# Patient Record
Sex: Male | Born: 1970 | Race: Black or African American | Hispanic: No | Marital: Single | Smoking: Current every day smoker
Health system: Southern US, Community
[De-identification: ages and names within clinical notes are randomized; demographics above are authoritative.]

---

## 2000-01-23 ENCOUNTER — Emergency Department (HOSPITAL_COMMUNITY): Admission: EM | Admit: 2000-01-23 | Discharge: 2000-01-23 | Payer: Self-pay | Admitting: Emergency Medicine

## 2004-10-20 ENCOUNTER — Emergency Department (HOSPITAL_COMMUNITY): Admission: EM | Admit: 2004-10-20 | Discharge: 2004-10-20 | Payer: Self-pay | Admitting: Emergency Medicine

## 2008-10-14 ENCOUNTER — Emergency Department (HOSPITAL_COMMUNITY): Admission: EM | Admit: 2008-10-14 | Discharge: 2008-10-14 | Payer: Self-pay | Admitting: Emergency Medicine

## 2009-02-20 ENCOUNTER — Emergency Department (HOSPITAL_COMMUNITY): Admission: EM | Admit: 2009-02-20 | Discharge: 2009-02-20 | Payer: Self-pay | Admitting: Emergency Medicine

## 2009-02-21 ENCOUNTER — Ambulatory Visit (HOSPITAL_COMMUNITY): Admission: RE | Admit: 2009-02-21 | Discharge: 2009-02-21 | Payer: Self-pay | Admitting: Physician Assistant

## 2009-10-19 ENCOUNTER — Emergency Department (HOSPITAL_COMMUNITY): Admission: EM | Admit: 2009-10-19 | Discharge: 2009-10-19 | Payer: Self-pay | Admitting: Emergency Medicine

## 2009-11-25 ENCOUNTER — Emergency Department (HOSPITAL_COMMUNITY): Admission: EM | Admit: 2009-11-25 | Discharge: 2009-11-25 | Payer: Self-pay | Admitting: Emergency Medicine

## 2010-06-30 ENCOUNTER — Emergency Department (HOSPITAL_COMMUNITY): Admission: EM | Admit: 2010-06-30 | Discharge: 2010-06-30 | Payer: Self-pay | Admitting: Emergency Medicine

## 2010-12-27 ENCOUNTER — Emergency Department (HOSPITAL_COMMUNITY)
Admission: EM | Admit: 2010-12-27 | Discharge: 2010-12-27 | Payer: Self-pay | Source: Home / Self Care | Admitting: Emergency Medicine

## 2011-06-16 ENCOUNTER — Emergency Department (HOSPITAL_COMMUNITY)
Admission: EM | Admit: 2011-06-16 | Discharge: 2011-06-16 | Disposition: A | Discharge status: Home / Self Care | Payer: Commercial Managed Care - PPO | Attending: Emergency Medicine | Admitting: Emergency Medicine

## 2011-06-16 DIAGNOSIS — L03211 Cellulitis of face: Secondary | ICD-10-CM | POA: Insufficient documentation

## 2011-06-16 DIAGNOSIS — L0201 Cutaneous abscess of face: Secondary | ICD-10-CM | POA: Insufficient documentation

## 2011-08-05 ENCOUNTER — Emergency Department (HOSPITAL_COMMUNITY)
Admission: EM | Admit: 2011-08-05 | Discharge: 2011-08-05 | Disposition: A | Discharge status: Home / Self Care | Payer: Commercial Managed Care - PPO | Attending: Emergency Medicine | Admitting: Emergency Medicine

## 2011-08-05 DIAGNOSIS — L0201 Cutaneous abscess of face: Secondary | ICD-10-CM | POA: Insufficient documentation

## 2011-08-05 DIAGNOSIS — L03211 Cellulitis of face: Secondary | ICD-10-CM | POA: Insufficient documentation

## 2013-11-06 ENCOUNTER — Emergency Department (HOSPITAL_COMMUNITY)
Admission: EM | Admit: 2013-11-06 | Discharge: 2013-11-06 | Disposition: A | Discharge status: Home / Self Care | Payer: Managed Care, Other (non HMO) | Attending: Emergency Medicine | Admitting: Emergency Medicine

## 2013-11-06 ENCOUNTER — Emergency Department (HOSPITAL_COMMUNITY): Payer: Managed Care, Other (non HMO)

## 2013-11-06 ENCOUNTER — Encounter (HOSPITAL_COMMUNITY): Payer: Self-pay | Admitting: Emergency Medicine

## 2013-11-06 DIAGNOSIS — F17200 Nicotine dependence, unspecified, uncomplicated: Secondary | ICD-10-CM | POA: Insufficient documentation

## 2013-11-06 DIAGNOSIS — M5416 Radiculopathy, lumbar region: Secondary | ICD-10-CM

## 2013-11-06 DIAGNOSIS — IMO0001 Reserved for inherently not codable concepts without codable children: Secondary | ICD-10-CM | POA: Insufficient documentation

## 2013-11-06 DIAGNOSIS — IMO0002 Reserved for concepts with insufficient information to code with codable children: Secondary | ICD-10-CM | POA: Insufficient documentation

## 2013-11-06 DIAGNOSIS — R209 Unspecified disturbances of skin sensation: Secondary | ICD-10-CM | POA: Insufficient documentation

## 2013-11-06 MED ORDER — DIAZEPAM 5 MG PO TABS: 5.0000 mg | ORAL_TABLET | Freq: Four times a day (QID) | ORAL | Status: DC | PRN

## 2013-11-06 MED ORDER — PREDNISONE 20 MG PO TABS
60.0000 mg | ORAL_TABLET | Freq: Once | ORAL | Status: AC
  Administered 2013-11-06: 60 mg via ORAL
  Filled 2013-11-06: qty 3

## 2013-11-06 MED ORDER — HYDROMORPHONE HCL PF 1 MG/ML IJ SOLN
1.0000 mg | Freq: Once | INTRAMUSCULAR | Status: AC
  Administered 2013-11-06: 1 mg via INTRAMUSCULAR
  Filled 2013-11-06: qty 1

## 2013-11-06 MED ORDER — OXYCODONE-ACETAMINOPHEN 5-325 MG PO TABS: 1.0000 | ORAL_TABLET | ORAL | Status: DC | PRN

## 2013-11-06 MED ORDER — PREDNISONE 10 MG PO TABS: ORAL_TABLET | ORAL | Status: DC

## 2013-11-06 MED ORDER — DIAZEPAM 5 MG PO TABS
5.0000 mg | ORAL_TABLET | Freq: Once | ORAL | Status: AC
  Administered 2013-11-06: 5 mg via ORAL
  Filled 2013-11-06: qty 1

## 2013-11-06 NOTE — ED Provider Notes (Signed)
CSN: [id number]     Arrival date & time 11/06/13  1237 History  This chart was scribed for non-physician practitioner, Jaynie Crumble, PA-C working with Gilda Crease, MD by Greggory Stallion, ED scribe. This patient was seen in room TR07C/TR07C and the patient's care was started at 2:23 PM.   Chief Complaint  Patient presents with  . Back Pain   The history is provided by the patient. No language interpreter was used.   HPI Comments: Stephen Nielsen is a 42 y.o. male who presents to the Emergency Department complaining of gradual onset, constant lower back pain that radiates into his right leg that stared this morning. Denies injury. Pt states he was awake for a few hours before the pain started. Movement worsens the pain. He states he has some numbness down his bilateral thighs. Pt has used icy hot and taken hydrocodone around 11 AM with no relief. Denies fever, neck pain, abdominal pain, bowel or bladder incontinence. He has history of back pain but states this is different than his normal pain.   History reviewed. No pertinent past medical history. History reviewed. No pertinent past surgical history. No family history on file. History  Substance Use Topics  . Smoking status: Current Every Day Smoker  . Smokeless tobacco: Not on file  . Alcohol Use: Yes    Review of Systems  Constitutional: Negative for fever.  Gastrointestinal: Negative for abdominal pain.  Genitourinary:       Negative for bowel or bladder incontinence.   Musculoskeletal: Positive for back pain and myalgias. Negative for neck pain.  Neurological: Positive for numbness.  All other systems reviewed and are negative.    Allergies  Review of patient's allergies indicates no known allergies.  Home Medications  No current outpatient prescriptions on file.  BP 140/85  Pulse 97  Temp(Src) 98.8 F (37.1 C) (Oral)  Resp 16  Ht 5\' 9"  (1.753 m)  Wt 170 lb (77.111 kg)  BMI 25.09 kg/m2  SpO2  96%  Physical Exam  Nursing note and vitals reviewed. Constitutional: He is oriented to person, place, and time. He appears well-developed and well-nourished. No distress.  HENT:  Head: Normocephalic and atraumatic.  Eyes: EOM are normal.  Neck: Neck supple. No tracheal deviation present.  Cardiovascular: Normal rate.   Pulmonary/Chest: Effort normal. No respiratory distress.  Musculoskeletal: Normal range of motion.  Midline lumbar spine tenderness. No paravertebral tenderness. Tenderness in right buttock and right SI joint. Pain with bilateral straight leg rainse  Neurological: He is alert and oriented to person, place, and time.  Reflex Scores:      Patellar reflexes are 3+ on the right side and 3+ on the left side. 5/5 lower extremity strength. Normal sensation in bilateral thighs, lower legs and feet. Pt able to dorsiflex bilateral feet and great toes.  Skin: Skin is warm and dry.  Psychiatric: He has a normal mood and affect. His behavior is normal.    ED Course  Procedures (including critical care time)  DIAGNOSTIC STUDIES: Oxygen Saturation is 96% on RA, normal by my interpretation.    COORDINATION OF CARE: 2:27 PM-Discussed treatment plan which includes xray with pt at bedside and pt agreed to plan.   Labs Review Labs Reviewed - No data to display Imaging Review Dg Lumbar Spine Complete  11/06/2013   CLINICAL DATA:  Low back pain radiating into the right leg. No known injury.  EXAM: LUMBAR SPINE - COMPLETE 4+ VIEW  COMPARISON:  Lumbar spine radiographs  10/19/2009.  FINDINGS: There are 5 lumbar type vertebral bodies. The alignment is normal. There is stable mild narrowing of the L4-5 disc space. The additional disc spaces are preserved. There is mild intervertebral spurring from L2-3 through L4-5. Oblique views demonstrate no pars defect or significant facet arthropathy. There is no evidence of acute fracture.  IMPRESSION: Stable mild disc space narrowing at L4-5. No acute  osseous findings.   Electronically Signed   By: Roxy Horseman M.D.   On: 11/06/2013 15:09    EKG Interpretation   None       MDM   1. Lumbar radiculopathy     PT with lower back pain. Hx of the same. States was doing well up until this morning when was just walking in his house and felt sharp pain going down both legs. States: Down right leg with a left. Currently denies any numbness or weakness in his legs. No loss of bowels or urine. He has no signs of cauda equina. He is afebrile. He denies any abdominal pain. He has no urinary symptoms. He does have history of degenerative disc disease. X-ray obtained due to midline tenderness and showed stable mild disc space narrowing at L4-L5. I suspect his symptoms could be do to a herniated disc. Will treat with steroids, pain medications, muscle relaxant. Patient is to followup with his doctor if pain is not improving for further imaging and treatment.  Filed Vitals:   11/06/13 1240 11/06/13 1245  BP: 140/85   Pulse: 97   Temp: 98.8 F (37.1 C)   TempSrc: Oral   Resp: 16   Height:  5\' 9"  (1.753 m)  Weight:  170 lb (77.111 kg)  SpO2: 96%    I personally performed the services described in this documentation, which was scribed in my presence. The recorded information has been reviewed and is accurate.    Lottie Mussel, PA-C 11/06/13 1535

## 2013-11-06 NOTE — ED Notes (Signed)
Pt c/o low back pain onset today. Pt tried icy hot and hydrocodone without relief. Pt denies recent injury. Pt reports numbness at finger tips and down B/L legs. Pt denies incontinence of bowel or urine.

## 2013-11-08 NOTE — ED Provider Notes (Signed)
Medical screening examination/treatment/procedure(s) were performed by non-physician practitioner and as supervising physician I was immediately available for consultation/collaboration.  Gilda Crease, MD 11/08/13 [phone]

## 2014-07-15 ENCOUNTER — Emergency Department (HOSPITAL_COMMUNITY)
Admission: EM | Admit: 2014-07-15 | Discharge: 2014-07-15 | Disposition: A | Discharge status: Home / Self Care | Payer: 59 | Attending: Emergency Medicine | Admitting: Emergency Medicine

## 2014-07-15 ENCOUNTER — Encounter (HOSPITAL_COMMUNITY): Payer: Self-pay | Admitting: Emergency Medicine

## 2014-07-15 DIAGNOSIS — Z79899 Other long term (current) drug therapy: Secondary | ICD-10-CM | POA: Diagnosis not present

## 2014-07-15 DIAGNOSIS — L089 Local infection of the skin and subcutaneous tissue, unspecified: Secondary | ICD-10-CM | POA: Diagnosis not present

## 2014-07-15 DIAGNOSIS — W57XXXA Bitten or stung by nonvenomous insect and other nonvenomous arthropods, initial encounter: Principal | ICD-10-CM

## 2014-07-15 DIAGNOSIS — IMO0001 Reserved for inherently not codable concepts without codable children: Secondary | ICD-10-CM | POA: Insufficient documentation

## 2014-07-15 DIAGNOSIS — IMO0002 Reserved for concepts with insufficient information to code with codable children: Secondary | ICD-10-CM | POA: Insufficient documentation

## 2014-07-15 DIAGNOSIS — T148 Other injury of unspecified body region: Secondary | ICD-10-CM | POA: Diagnosis not present

## 2014-07-15 DIAGNOSIS — Y9289 Other specified places as the place of occurrence of the external cause: Secondary | ICD-10-CM | POA: Diagnosis not present

## 2014-07-15 DIAGNOSIS — F17200 Nicotine dependence, unspecified, uncomplicated: Secondary | ICD-10-CM | POA: Insufficient documentation

## 2014-07-15 DIAGNOSIS — Y9389 Activity, other specified: Secondary | ICD-10-CM | POA: Insufficient documentation

## 2014-07-15 DIAGNOSIS — L03113 Cellulitis of right upper limb: Secondary | ICD-10-CM

## 2014-07-15 MED ORDER — CEPHALEXIN 250 MG PO CAPS
500.0000 mg | ORAL_CAPSULE | Freq: Once | ORAL | Status: AC
  Administered 2014-07-15: 500 mg via ORAL
  Filled 2014-07-15: qty 2

## 2014-07-15 MED ORDER — SULFAMETHOXAZOLE-TMP DS 800-160 MG PO TABS
1.0000 | ORAL_TABLET | Freq: Once | ORAL | Status: AC
  Administered 2014-07-15: 1 via ORAL
  Filled 2014-07-15: qty 1

## 2014-07-15 MED ORDER — CEPHALEXIN 500 MG PO CAPS: 500.0000 mg | ORAL_CAPSULE | Freq: Four times a day (QID) | ORAL | Status: DC

## 2014-07-15 MED ORDER — SULFAMETHOXAZOLE-TRIMETHOPRIM 800-160 MG PO TABS: 1.0000 | ORAL_TABLET | Freq: Two times a day (BID) | ORAL | Status: DC

## 2014-07-15 NOTE — ED Notes (Signed)
On arrival to room TR 4 pt stated "Why am here? I think i have a bug bite. Where are my children ? How did I get here?EDP Wentz in pt room with PA to assess PT. Pt answered all questions A/O x4 in the room with EDP.

## 2014-07-15 NOTE — Discharge Instructions (Signed)
Warm compresses. Keep it clean. Wash with soap and water. Take keflex and bactrim both as prescribed until all gone. Follow up in 2 days for recheck.    Abscess Care After An abscess (also called a boil or furuncle) is an infected area that contains a collection of pus. Signs and symptoms of an abscess include pain, tenderness, redness, or hardness, or you may feel a moveable soft area under your skin. An abscess can occur anywhere in the body. The infection may spread to surrounding tissues causing cellulitis. A cut (incision) by the surgeon was made over your abscess and the pus was drained out. Gauze may have been packed into the space to provide a drain that will allow the cavity to heal from the inside outwards. The boil may be painful for 5 to 7 days. Most people with a boil do not have high fevers. Your abscess, if seen early, may not have localized, and may not have been lanced. If not, another appointment may be required for this if it does not get better on its own or with medications. HOME CARE INSTRUCTIONS   Only take over-the-counter or prescription medicines for pain, discomfort, or fever as directed by your caregiver.  When you bathe, soak and then remove gauze or iodoform packs at least daily or as directed by your caregiver. You may then wash the wound gently with mild soapy water. Repack with gauze or do as your caregiver directs. SEEK IMMEDIATE MEDICAL CARE IF:   You develop increased pain, swelling, redness, drainage, or bleeding in the wound site.  You develop signs of generalized infection including muscle aches, chills, fever, or a general ill feeling.  An oral temperature above 102 F (38.9 C) develops, not controlled by medication. See your caregiver for a recheck if you develop any of the symptoms described above. If medications (antibiotics) were prescribed, take them as directed. Document Released: 06/19/2005 Document Revised: 02/23/2012 Document Reviewed:  02/14/2008 [facility] Patient Information 2015 [place], [place]. This information is not intended to replace advice given to you by your health care provider. Make sure you discuss any questions you have with your health care provider.

## 2014-07-15 NOTE — ED Provider Notes (Signed)
CSN: [id number]     Arrival date & time 07/15/14  1612 History  This chart was scribed for Jaynie Crumble, PA, working with Flint Melter, MD by Chestine Spore, ED Scribe. The patient was seen in room TR04C/TR04C at 4:42 PM.   Chief Complaint  Patient presents with  . Insect Bite     The history is provided by the patient. No language interpreter was used.   HPI Comments: Stephen Nielsen is a 43 y.o. male who presents to the Emergency Department complaining of an insect bite onset yesterday. He states that he does not know why he is here. He states that he drove himself here because of his arm. He states that he knows where he is. He states that got bit by something at the pool yesterday. He denies any pain to the area.  He denies fevers, chills, and any other associated symptoms.  He denies any alcohol use today. No treatment tried prior to their arrival. No history of the same.   History reviewed. No pertinent past medical history. History reviewed. No pertinent past surgical history. History reviewed. No pertinent family history. History  Substance Use Topics  . Smoking status: Current Every Day Smoker    Types: Cigarettes  . Smokeless tobacco: Not on file  . Alcohol Use: Yes    Review of Systems  Constitutional: Negative for fever and chills.  Skin: Positive for color change (redness to the area) and wound (insect bite to the right forearm).       Swelling around the insect bite      Allergies  Review of patient's allergies indicates no known allergies.  Home Medications   Prior to Admission medications   Medication Sig Start Date End Date Taking? Authorizing Provider  diazepam (VALIUM) 5 MG tablet Take 1 tablet (5 mg total) by mouth every 6 (six) hours as needed for muscle spasms. 11/06/13   Brayla Pat A Dempsey Ahonen, PA-C  Hydrocodone-Acetaminophen (VICODIN PO) Take 1 tablet by mouth once.    Historical Provider, MD  oxyCODONE-acetaminophen (PERCOCET) 5-325 MG per  tablet Take 1 tablet by mouth every 4 (four) hours as needed for severe pain. 11/06/13   Loralee Weitzman A Geofrey Silliman, PA-C  predniSONE (DELTASONE) 10 MG tablet Take 5 tab day 1, take 4 tab day 2, take 3 tab day 3, take 2 tab day 4, and take 1 tab day 5 11/06/13   Evah Rashid A Zaid Tomes, PA-C   BP 149/80  Pulse 94  Temp(Src) 98.6 F (37 C) (Oral)  Resp 18  SpO2 97%  Physical Exam  Nursing note and vitals reviewed. Constitutional: He is oriented to person, place, and time. He appears well-developed and well-nourished. No distress.  HENT:  Head: Normocephalic and atraumatic.  Eyes: EOM are normal.  Neck: Neck supple. No tracheal deviation present.  Cardiovascular: Normal rate.   Pulmonary/Chest: Effort normal. No respiratory distress.  Musculoskeletal: Normal range of motion.  Neurological: He is alert and oriented to person, place, and time.  Skin: Skin is warm and dry.  Less than 1 cm pustule to the right forearm, small purulent drainage, no surrounding fluctuance. Mild induration erythema extending up her right arm with a streak going approximately halfway up the right upper arm.  erythema extends to the mid forearm. Tender to palpation. Full range of motion of the wrist and elbow joints. Radial pulses intact. compartments are soft.   Psychiatric: He has a normal mood and affect. His behavior is normal.    ED Course  Procedures (including critical care time) DIAGNOSTIC STUDIES: Oxygen Saturation is 97% on room air, normal by my interpretation.    COORDINATION OF CARE: 4:55 PM-Discussed treatment plan which includes Keflex and Bactrim with pt at bedside and pt agreed to plan.   Labs Review Labs Reviewed - No data to display  Imaging Review No results found.   EKG Interpretation None      MDM   Final diagnoses:  Pustule  Cellulitis of right forearm   Patient is here with a small pustule to the right forearm with surrounding cellulitis. Suspect possible infection. Will treat  with Keflex and Bactrim. Rales in exam room, patient had 2 episodes of bizarre behavior where he all of a sudden stated that he did not know where he was and did not know where his wife and kids were. When I assessed him further, he was completely oriented to self, place, time. He then quickly said "of course I know where my wife and kids are, their home." Patient did not have any neuro deficits on exam. I discussed this with Dr. Effie ShyWentz who came by and saw patient, and agreed that patient is okay to be discharged home with treatment for his cellulitis.  Filed Vitals:   07/15/14 1616  BP: 149/80  Pulse: 94  Temp: 98.6 F (37 C)  Resp: 18      I personally performed the services described in this documentation, which was scribed in my presence. The recorded information has been reviewed and is accurate.     Lottie Musselatyana A Andras Grunewald, PA-C 07/15/14 1725

## 2014-07-15 NOTE — ED Notes (Signed)
Declined W/C at D/C and was escorted to lobby by RN. 

## 2014-07-15 NOTE — ED Provider Notes (Signed)
  Face-to-face evaluation   History: He was bitten or stung by an insect yesterday, at a water park. Today, the pain is worse, and he has redness, associated with the area, as well as on his right upper arm Physical exam: Alert, somewhat anxious, but cooperative and calm. He is well oriented. He is lucid. Right forearm has an open area about 1 cm, with small amount of drainage and associated induration. There is mild erythema spreading proximally to the mid upper arm.  Medical screening examination/treatment/procedure(s) were conducted as a shared visit with non-physician practitioner(s) and myself.  I personally evaluated the patient during the encounter  Flint MelterElliott L Thurley Francesconi, MD 07/16/14 [phone]

## 2014-07-15 NOTE — ED Notes (Signed)
Pt presents to department for evaluation of insect bite to R arm. Noticed today after swimming. Redness and swelling noted. Pt is alert and oriented x4.

## 2015-03-18 ENCOUNTER — Encounter (HOSPITAL_COMMUNITY): Payer: Self-pay

## 2015-03-18 ENCOUNTER — Emergency Department (HOSPITAL_COMMUNITY)
Admission: EM | Admit: 2015-03-18 | Discharge: 2015-03-18 | Disposition: A | Discharge status: Home / Self Care | Payer: Commercial Managed Care - PPO | Attending: Emergency Medicine | Admitting: Emergency Medicine

## 2015-03-18 DIAGNOSIS — R21 Rash and other nonspecific skin eruption: Secondary | ICD-10-CM

## 2015-03-18 DIAGNOSIS — Z720 Tobacco use: Secondary | ICD-10-CM | POA: Insufficient documentation

## 2015-03-18 DIAGNOSIS — Z792 Long term (current) use of antibiotics: Secondary | ICD-10-CM | POA: Insufficient documentation

## 2015-03-18 MED ORDER — DIPHENHYDRAMINE HCL 25 MG PO TABS: 25.0000 mg | ORAL_TABLET | Freq: Four times a day (QID) | ORAL | Status: DC

## 2015-03-18 MED ORDER — HYDROCORTISONE 1 % EX CREA: TOPICAL_CREAM | CUTANEOUS | Status: DC

## 2015-03-18 NOTE — ED Notes (Signed)
Declined W/C at D/C and was escorted to lobby by RN. 

## 2015-03-18 NOTE — ED Provider Notes (Signed)
CSN: [id number]     Arrival date & time 03/18/15  1638 History  This chart was scribed for non-physician practitioner, Junius Finner, PA-C working with Jerelyn Scott, MD by Greggory Stallion, ED scribe. This patient was seen in room TR10C/TR10C and the patient's care was started at 4:55 PM.   Chief Complaint  Patient presents with  . Rash   The history is provided by the patient. No language interpreter was used.    HPI Comments: Stephen Nielsen is a 44 y.o. male who presents to the Emergency Department complaining of an itchy rash to his posterior neck that started this morning when he woke up. No pain to rash. Pt states he stayed in a motel last night. He denies other rashes, trouble breathing. No fever, chills, n/v/d. Pt has not yet taken any medications.   History reviewed. No pertinent past medical history. History reviewed. No pertinent past surgical history. History reviewed. No pertinent family history. History  Substance Use Topics  . Smoking status: Current Every Day Smoker -- 0.50 packs/day    Types: Cigarettes  . Smokeless tobacco: Not on file  . Alcohol Use: Yes    Review of Systems  Skin: Positive for rash.  All other systems reviewed and are negative.  Allergies  Review of patient's allergies indicates no known allergies.  Home Medications   Prior to Admission medications   Medication Sig Start Date End Date Taking? Authorizing Provider  cephALEXin (KEFLEX) 500 MG capsule Take 1 capsule (500 mg total) by mouth 4 (four) times daily. 07/15/14   Tatyana Kirichenko, PA-C  diazepam (VALIUM) 5 MG tablet Take 1 tablet (5 mg total) by mouth every 6 (six) hours as needed for muscle spasms. 11/06/13   Tatyana Kirichenko, PA-C  diphenhydrAMINE (BENADRYL) 25 MG tablet Take 1 tablet (25 mg total) by mouth every 6 (six) hours. 03/18/15   Junius Finner, PA-C  Hydrocodone-Acetaminophen (VICODIN PO) Take 1 tablet by mouth once.    Historical Provider, MD  hydrocortisone cream 1 % Apply to  affected area 2 times daily 03/18/15   Junius Finner, PA-C  oxyCODONE-acetaminophen (PERCOCET) 5-325 MG per tablet Take 1 tablet by mouth every 4 (four) hours as needed for severe pain. 11/06/13   Tatyana Kirichenko, PA-C  predniSONE (DELTASONE) 10 MG tablet Take 5 tab day 1, take 4 tab day 2, take 3 tab day 3, take 2 tab day 4, and take 1 tab day 5 11/06/13   Tatyana Kirichenko, PA-C  sulfamethoxazole-trimethoprim (SEPTRA DS) 800-160 MG per tablet Take 1 tablet by mouth every 12 (twelve) hours. 07/15/14   Tatyana Kirichenko, PA-C   BP 159/96 mmHg  Pulse 114  Temp(Src) 99.4 F (37.4 C) (Oral)  Resp 16  Ht  (1.753 m)  Wt 164 lb 1.6 oz (74.435 kg)  BMI 24.22 kg/m2  SpO2 94%   Physical Exam  Constitutional: He is oriented to person, place, and time. He appears well-developed and well-nourished.  HENT:  Head: Normocephalic and atraumatic.  Eyes: EOM are normal.  Neck: Normal range of motion.  Cardiovascular: Normal rate.   Pulmonary/Chest: Effort normal. No respiratory distress.  Musculoskeletal: Normal range of motion.  Neurological: He is alert and oriented to person, place, and time.  Skin: Skin is warm and dry.  Four erythematous, raised welt-like lesions at base of posterior neck. Non tender. No induration or fluctuance. No red streaking. No bleeding or discharge.   Psychiatric: He has a normal mood and affect. His behavior is normal.  Nursing  note and vitals reviewed.   ED Course  Procedures (including critical care time)  DIAGNOSTIC STUDIES: Oxygen Saturation is 94% on RA, adequate by my interpretation.    COORDINATION OF CARE: 4:56 PM-Discussed treatment plan which includes hydrocortisone cream and benadryl with pt at bedside and pt agreed to plan.   Labs Review Labs Reviewed - No data to display  Imaging Review No results found.   EKG Interpretation None      MDM   Final diagnoses:  Rash   Rash c/w localized reaction, possibly from an insect bite. No rash  beyond welts on back of neck. No evidence of underlying infection. No evidence of anaphylactic reaction. Will tx with benadryl and hydrocortisone cream.  I personally performed the services described in this documentation, which was scribed in my presence. The recorded information has been reviewed and is accurate.   Junius Finnerrin O'Malley, PA-C 03/18/15 1705  Jerelyn ScottMartha Linker, MD 03/18/15 [phone]

## 2015-03-18 NOTE — ED Notes (Signed)
Pt stayed in motel last night, woke up this morning with unknown bites on back of neck.  Itching.  No other symptoms noted.

## 2016-02-03 ENCOUNTER — Emergency Department (HOSPITAL_COMMUNITY): Payer: Commercial Managed Care - PPO

## 2016-02-03 ENCOUNTER — Encounter (HOSPITAL_COMMUNITY): Payer: Self-pay | Admitting: Neurology

## 2016-02-03 ENCOUNTER — Emergency Department (HOSPITAL_COMMUNITY)
Admission: EM | Admit: 2016-02-03 | Discharge: 2016-02-03 | Disposition: A | Discharge status: Home / Self Care | Payer: Commercial Managed Care - PPO | Attending: Emergency Medicine | Admitting: Emergency Medicine

## 2016-02-03 DIAGNOSIS — X58XXXA Exposure to other specified factors, initial encounter: Secondary | ICD-10-CM | POA: Insufficient documentation

## 2016-02-03 DIAGNOSIS — Y9389 Activity, other specified: Secondary | ICD-10-CM | POA: Insufficient documentation

## 2016-02-03 DIAGNOSIS — F17210 Nicotine dependence, cigarettes, uncomplicated: Secondary | ICD-10-CM | POA: Insufficient documentation

## 2016-02-03 DIAGNOSIS — T7840XA Allergy, unspecified, initial encounter: Secondary | ICD-10-CM

## 2016-02-03 DIAGNOSIS — Y998 Other external cause status: Secondary | ICD-10-CM | POA: Insufficient documentation

## 2016-02-03 DIAGNOSIS — Y9289 Other specified places as the place of occurrence of the external cause: Secondary | ICD-10-CM | POA: Insufficient documentation

## 2016-02-03 LAB — BASIC METABOLIC PANEL
ANION GAP: 16 — AB (ref 5–15)
BUN: 14 mg/dL (ref 6–20)
CO2: 20 mmol/L — ABNORMAL LOW (ref 22–32)
CREATININE: 0.87 mg/dL (ref 0.61–1.24)
Calcium: 9.3 mg/dL (ref 8.9–10.3)
Chloride: 102 mmol/L (ref 101–111)
GFR calc Af Amer: >60 mL/min (ref >60)
GFR calc non Af Amer: >60 mL/min (ref >60)
Glucose, Bld: 151 mg/dL — ABNORMAL HIGH (ref 65–99)
Potassium: 4.2 mmol/L (ref 3.5–5.1)
Sodium: 138 mmol/L (ref 135–145)

## 2016-02-03 LAB — CBC WITH DIFFERENTIAL/PLATELET
Basophils Absolute: 0 10*3/uL (ref 0.0–0.1)
Basophils Relative: 0 %
EOS ABS: 0.1 10*3/uL (ref 0.0–0.7)
Eosinophils Relative: 1 %
HCT: 44.9 % (ref 39.0–52.0)
HEMOGLOBIN: 15.1 g/dL (ref 13.0–17.0)
LYMPHS ABS: 2 10*3/uL (ref 0.7–4.0)
Lymphocytes Relative: 26 %
MCH: 33 pg (ref 26.0–34.0)
MCHC: 33.6 g/dL (ref 30.0–36.0)
MCV: 98 fL (ref 78.0–100.0)
Monocytes Absolute: 0.9 10*3/uL (ref 0.1–1.0)
Monocytes Relative: 11 %
NEUTROS PCT: 62 %
Neutro Abs: 4.9 10*3/uL (ref 1.7–7.7)
Platelets: 225 10*3/uL (ref 150–400)
RBC: 4.58 MIL/uL (ref 4.22–5.81)
RDW: 14.7 % (ref 11.5–15.5)
WBC: 7.9 10*3/uL (ref 4.0–10.5)

## 2016-02-03 MED ORDER — PREDNISONE 20 MG PO TABS: 20.0000 mg | ORAL_TABLET | Freq: Two times a day (BID) | ORAL | Status: DC

## 2016-02-03 MED ORDER — IPRATROPIUM BROMIDE 0.02 % IN SOLN
0.5000 mg | Freq: Once | RESPIRATORY_TRACT | Status: AC
  Administered 2016-02-03: 0.5 mg via RESPIRATORY_TRACT
  Filled 2016-02-03: qty 2.5

## 2016-02-03 MED ORDER — METHYLPREDNISOLONE SODIUM SUCC 125 MG IJ SOLR
125.0000 mg | Freq: Once | INTRAMUSCULAR | Status: AC
  Administered 2016-02-03: 125 mg via INTRAVENOUS
  Filled 2016-02-03: qty 2

## 2016-02-03 MED ORDER — DIPHENHYDRAMINE HCL 25 MG PO TABS: 25.0000 mg | ORAL_TABLET | Freq: Four times a day (QID) | ORAL | Status: DC

## 2016-02-03 MED ORDER — FAMOTIDINE 20 MG PO TABS: 20.0000 mg | ORAL_TABLET | Freq: Two times a day (BID) | ORAL | Status: DC

## 2016-02-03 MED ORDER — SODIUM CHLORIDE 0.9 % IV BOLUS (SEPSIS)
500.0000 mL | Freq: Once | INTRAVENOUS | Status: AC
  Administered 2016-02-03: 500 mL via INTRAVENOUS

## 2016-02-03 MED ORDER — DIPHENHYDRAMINE HCL 50 MG/ML IJ SOLN
25.0000 mg | Freq: Once | INTRAMUSCULAR | Status: AC
  Administered 2016-02-03: 25 mg via INTRAVENOUS
  Filled 2016-02-03: qty 1

## 2016-02-03 MED ORDER — ALBUTEROL SULFATE (2.5 MG/3ML) 0.083% IN NEBU
5.0000 mg | INHALATION_SOLUTION | Freq: Once | RESPIRATORY_TRACT | Status: AC
  Administered 2016-02-03: 5 mg via RESPIRATORY_TRACT
  Filled 2016-02-03: qty 6

## 2016-02-03 MED ORDER — FAMOTIDINE IN NACL 20-0.9 MG/50ML-% IV SOLN
20.0000 mg | Freq: Once | INTRAVENOUS | Status: AC
  Administered 2016-02-03: 20 mg via INTRAVENOUS
  Filled 2016-02-03: qty 50

## 2016-02-03 NOTE — ED Notes (Signed)
Pt reports woke up this morning at 9 am with swelling in his throat and feeling like he couldn't breath or swallow. Pt initially was anxious, sob , holding his throat, was diaphoretic. 99% RA

## 2016-02-03 NOTE — ED Provider Notes (Signed)
CSN: [id number]     Arrival date & time 02/03/16  0940 History   First MD Initiated Contact with Patient 02/03/16 [phone]     Chief Complaint  Patient presents with  . Allergic Reaction     (Consider location/radiation/quality/duration/timing/severity/associated sxs/prior Treatment) HPI   Stephen Nielsen is a 45 y.o. male who presents for evaluation of difficulty breathing, talking, and a sensation of swelling in his throat. He reports onset of the symptoms, upon arising, this morning. No known new food exposures, chemicals, or ingestions. He was at a picnic yesterday, eating various foods which he was not familiar with their origin. No prior similar problems. No recent fever, chills, cough, other episodes of shortness of breath, weakness or dizziness. There are no other known modifying factors.   History reviewed. No pertinent past medical history. History reviewed. No pertinent past surgical history. No family history on file. Social History  Substance Use Topics  . Smoking status: Current Every Day Smoker -- 0.50 packs/day    Types: Cigarettes  . Smokeless tobacco: None  . Alcohol Use: Yes    Review of Systems  All other systems reviewed and are negative.     Allergies  Review of patient's allergies indicates no known allergies.  Home Medications   Prior to Admission medications   Medication Sig Start Date End Date Taking? Authorizing Provider  diphenhydrAMINE (BENADRYL) 25 MG tablet Take 1 tablet (25 mg total) by mouth every 6 (six) hours. 02/03/16   Mancel Bale, MD  famotidine (PEPCID) 20 MG tablet Take 1 tablet (20 mg total) by mouth 2 (two) times daily. 02/03/16   Mancel Bale, MD  predniSONE (DELTASONE) 20 MG tablet Take 1 tablet (20 mg total) by mouth 2 (two) times daily. 02/03/16   Mancel Bale, MD   BP 169/70 mmHg  Pulse 83  Resp 16  SpO2 96% Physical Exam  Constitutional: He is oriented to person, place, and time. He appears well-developed and  well-nourished. He appears distressed (Uncomfortable, restless).  HENT:  Head: Normocephalic and atraumatic.  Right Ear: External ear normal.  Left Ear: External ear normal.  Oropharynx with mild posterior erythema of the tonsillar pillars, without associated swelling, deformity, or exudate. No tonsillar hypertrophy. No obvious intraoral lesion or abscess. No trismus.  Eyes: Conjunctivae and EOM are normal. Pupils are equal, round, and reactive to light.  Neck: Normal range of motion and phonation normal. Neck supple.  No stridor. No swelling or deformity of the neck. No palpable anterior lymphadenopathy  Cardiovascular: Normal rate, regular rhythm and normal heart sounds.   Pulmonary/Chest: Effort normal and breath sounds normal. No respiratory distress. He has no wheezes. He has no rales. He exhibits no tenderness and no bony tenderness.  Tachypneic.  Abdominal: Soft. There is no tenderness.  Musculoskeletal: Normal range of motion.  Neurological: He is alert and oriented to person, place, and time. No cranial nerve deficit or sensory deficit. He exhibits normal muscle tone. Coordination normal.  Skin: Skin is warm, dry and intact.  Psychiatric: He has a normal mood and affect. His behavior is normal. Judgment and thought content normal.  Nursing note and vitals reviewed.   ED Course  Procedures (including critical care time)  Initial assessment is consistent with an specified allergic reaction without anaphylaxis or airway obstruction.  Medications  sodium chloride 0.9 % bolus 500 mL (0 mLs Intravenous Stopped 02/03/16 1057)  methylPREDNISolone sodium succinate (SOLU-MEDROL) 125 mg/2 mL injection 125 mg (125 mg Intravenous Given 02/03/16 0951)  famotidine (  PEPCID) IVPB 20 mg premix (0 mg Intravenous Stopped 02/03/16 1057)  diphenhydrAMINE (BENADRYL) injection 25 mg (25 mg Intravenous Given 02/03/16 0950)  albuterol (PROVENTIL) (2.5 MG/3ML) 0.083% nebulizer solution 5 mg (5 mg  Nebulization Given 02/03/16 1102)  ipratropium (ATROVENT) nebulizer solution 0.5 mg (0.5 mg Nebulization Given 02/03/16 1102)    Patient Vitals for the past 24 hrs:  BP Pulse Resp SpO2  02/03/16 1300 169/70 mmHg 83 16 96 %  02/03/16 1230 170/89 mmHg 84 17 98 %  02/03/16 1200 171/89 mmHg 82 19 95 %  02/03/16 1145 162/89 mmHg 82 17 96 %  02/03/16 1115 161/96 mmHg 78 12 100 %  02/03/16 1104 - - - 95 %  02/03/16 1045 158/89 mmHg 83 15 95 %  02/03/16 1030 145/90 mmHg 87 13 96 %  02/03/16 1003 151/89 mmHg 89 16 98 %    10:56 AM Reevaluation with update and discussion. After initial assessment and treatment, an updated evaluation reveals he feels like he can talk better, airway remains intact. Lungs have decreased air movement, without audible wheezes bilaterally. [name] L   CRITICAL CARE Performed by: Mancel Bale L Total critical care time: 45 minutes Critical care time was exclusive of separately billable procedures and treating other patients. Critical care was necessary to treat or prevent imminent or life-threatening deterioration. Critical care was time spent personally by me on the following activities: development of treatment plan with patient and/or surrogate as well as nursing, discussions with consultants, evaluation of patient's response to treatment, examination of patient, obtaining history from patient or surrogate, ordering and performing treatments and interventions, ordering and review of laboratory studies, ordering and review of radiographic studies, pulse oximetry and re-evaluation of patient's condition.   Labs Review Labs Reviewed  BASIC METABOLIC PANEL - Abnormal; Notable for the following:    CO2 20 (*)    Glucose, Bld 151 (*)    Anion gap 16 (*)    All other components within normal limits  CBC WITH DIFFERENTIAL/PLATELET    Imaging Review Dg Chest Port 1 View  02/03/2016  CLINICAL DATA:  Shortness of breath EXAM: PORTABLE CHEST 1 VIEW COMPARISON:   None. FINDINGS: Normal heart size. Mildly atherosclerotic aortic arch, otherwise normal mediastinal contour. No pneumothorax. No pleural effusion. No pulmonary edema . Mild hazy opacity in the left lower lung. IMPRESSION: Mild hazy left lower lung opacity, cannot exclude a developing pneumonia. Electronically Signed   By: Delbert Phenix M.D.   On: 02/03/2016 10:26   I have personally reviewed and evaluated these images and lab results as part of my medical decision-making.   EKG Interpretation None      MDM   Final diagnoses:  Allergic reaction, initial encounter    Allergic reaction, cause not clear, no airway compromise. He improved during the period of observation, ED. He did not get any relief, using albuterol. Therefore, he is not discharged on a bronchodilator. No evidence for anaphylaxis, serious back to infection or metabolic instability.  Nursing Notes Reviewed/ Care Coordinated Applicable Imaging Reviewed Interpretation of Laboratory Data incorporated into ED treatment  The patient appears reasonably screened and/or stabilized for discharge and I doubt any other medical condition or other [facility] requiring further screening, evaluation, or treatment in the ED at this time prior to discharge.  Plan: Home Medications- Prednisone, Pepcic, Benadryl; Home Treatments- rest; return here if the recommended treatment, does not improve the symptoms; Recommended follow up- PCP prn   Mancel Bale, MD 02/03/16 [phone]

## 2016-02-03 NOTE — Discharge Instructions (Signed)

## 2016-04-23 ENCOUNTER — Emergency Department (HOSPITAL_COMMUNITY)
Admission: EM | Admit: 2016-04-23 | Discharge: 2016-04-23 | Disposition: A | Discharge status: Home / Self Care | Payer: Commercial Managed Care - PPO | Attending: Emergency Medicine | Admitting: Emergency Medicine

## 2016-04-23 ENCOUNTER — Encounter (HOSPITAL_COMMUNITY): Payer: Self-pay

## 2016-04-23 DIAGNOSIS — B349 Viral infection, unspecified: Secondary | ICD-10-CM

## 2016-04-23 DIAGNOSIS — Z7982 Long term (current) use of aspirin: Secondary | ICD-10-CM | POA: Insufficient documentation

## 2016-04-23 DIAGNOSIS — F17210 Nicotine dependence, cigarettes, uncomplicated: Secondary | ICD-10-CM | POA: Insufficient documentation

## 2016-04-23 DIAGNOSIS — M546 Pain in thoracic spine: Secondary | ICD-10-CM | POA: Insufficient documentation

## 2016-04-23 LAB — URINALYSIS, ROUTINE W REFLEX MICROSCOPIC
Bilirubin Urine: NEGATIVE
GLUCOSE, UA: NEGATIVE mg/dL
Ketones, ur: 15 mg/dL — AB
Leukocytes, UA: NEGATIVE
Nitrite: NEGATIVE
Protein, ur: 30 mg/dL — AB
Specific Gravity, Urine: 1.018 (ref 1.005–1.030)
pH: 8 (ref 5.0–8.0)

## 2016-04-23 LAB — URINE MICROSCOPIC-ADD ON
Bacteria, UA: NONE SEEN
SQUAMOUS EPITHELIAL / LPF: NONE SEEN
WBC UA: NONE SEEN WBC/hpf (ref 0–5)

## 2016-04-23 MED ORDER — KETOROLAC TROMETHAMINE 30 MG/ML IJ SOLN
30.0000 mg | Freq: Once | INTRAMUSCULAR | Status: AC
  Administered 2016-04-23: 30 mg via INTRAVENOUS
  Filled 2016-04-23: qty 1

## 2016-04-23 MED ORDER — SODIUM CHLORIDE 0.9 % IV BOLUS (SEPSIS)
1000.0000 mL | Freq: Once | INTRAVENOUS | Status: AC
  Administered 2016-04-23: 1000 mL via INTRAVENOUS

## 2016-04-23 NOTE — ED Provider Notes (Signed)
CSN: [id number]     Arrival date & time 04/23/16  0630 History   First MD Initiated Contact with Patient 04/23/16 [phone]     Chief Complaint  Patient presents with  . Influenza      Patient is a 45 y.o. male presenting with flu symptoms.  Influenza Presenting symptoms: fatigue, myalgias, nausea and vomiting   Presenting symptoms: no shortness of breath and no sore throat   Associated symptoms: chills   Associated symptoms: no neck stiffness   Patient presents with flulike symptoms. States he's been hurting the last 2 days. States he ate solid over. States he's had a little bit of a cough is been nonproductive. He's had nausea and has vomited once. No diarrhea. States he hurts in his mid back also. No dysuria. No known sick contact but states that he works with [employer]. Has had some chills without clear fever. He is otherwise healthy.  History reviewed. No pertinent past medical history. History reviewed. No pertinent past surgical history. No family history on file. Social History  Substance Use Topics  . Smoking status: Current Every Day Smoker -- 0.50 packs/day    Types: Cigarettes  . Smokeless tobacco: None  . Alcohol Use: Yes     Comment: occassional    Review of Systems  Constitutional: Positive for chills, appetite change and fatigue.  HENT: Negative for sore throat.   Respiratory: Negative for shortness of breath.   Gastrointestinal: Positive for nausea and vomiting. Negative for abdominal pain.  Genitourinary: Negative for flank pain.  Musculoskeletal: Positive for myalgias and back pain. Negative for neck stiffness.  Skin: Negative for wound.  Neurological: Negative for weakness.  Psychiatric/Behavioral: Negative for confusion.      Allergies  Review of patient's allergies indicates no known allergies.  Home Medications   Prior to Admission medications   Medication Sig Start Date End Date Taking? Authorizing Provider  aspirin-sod bicarb-citric acid  (ALKA-SELTZER) 325 MG TBEF tablet Take 325 mg by mouth every 6 (six) hours as needed (flu like symptoms).   Yes Historical Provider, MD  diphenhydrAMINE (BENADRYL) 25 MG tablet Take 1 tablet (25 mg total) by mouth every 6 (six) hours. Patient not taking: Reported on 04/23/2016 02/03/16   Mancel Bale, MD  famotidine (PEPCID) 20 MG tablet Take 1 tablet (20 mg total) by mouth 2 (two) times daily. Patient not taking: Reported on 04/23/2016 02/03/16   Mancel Bale, MD  predniSONE (DELTASONE) 20 MG tablet Take 1 tablet (20 mg total) by mouth 2 (two) times daily. Patient not taking: Reported on 04/23/2016 02/03/16   Mancel Bale, MD   BP 155/92 mmHg  Pulse 68  Temp(Src) 98.6 F (37 C) (Oral)  Resp 18  SpO2 98% Physical Exam  Constitutional: He appears well-developed.  HENT:  Head: Atraumatic.  Mouth/Throat: No oropharyngeal exudate.  Neck: Neck supple.  Cardiovascular: Normal rate.   Pulmonary/Chest: Effort normal.  Abdominal: Soft. There is no tenderness.  Genitourinary:  Tenderness in bilateral CVA area.  Musculoskeletal: He exhibits no edema.  Lymphadenopathy:    He has no cervical adenopathy.  Neurological: He is alert.  Skin: Skin is warm.    ED Course  Procedures (including critical care time) Labs Review Labs Reviewed  URINALYSIS, ROUTINE W REFLEX MICROSCOPIC (NOT AT [facility]) - Abnormal; Notable for the following:    APPearance CLOUDY (*)    Hgb urine dipstick LARGE (*)    Ketones, ur 15 (*)    Protein, ur 30 (*)  All other components within normal limits  URINE MICROSCOPIC-ADD ON    Imaging Review No results found. I have personally reviewed and evaluated these images and lab results as part of my medical decision-making.   EKG Interpretation None      MDM   Final diagnoses:  Viral infection    Patient generalized weakness. Feels better after IV fluids and Toradol. Has had some chills. Lungs are clear. Urine does not show infection. At this point will  discharge home with likely viral syndrome. If does not improve fully more follow-up.    Benjiman CoreNathan Amaury Kuzel, MD 04/23/16 [phone]

## 2016-04-23 NOTE — Discharge Instructions (Signed)

## 2016-04-23 NOTE — ED Notes (Signed)
Pt. Developed symptoms on Tuesday, body aches,  denies any fever, but is having periods of sweating.  He denies any nausea or diarrhea but did vomit X1 this am.  Pt. Has decreased appetite.

## 2016-10-28 ENCOUNTER — Emergency Department (HOSPITAL_COMMUNITY)
Admission: EM | Admit: 2016-10-28 | Discharge: 2016-10-28 | Disposition: A | Discharge status: Home / Self Care | Payer: 59 | Attending: Emergency Medicine | Admitting: Emergency Medicine

## 2016-10-28 ENCOUNTER — Encounter (HOSPITAL_COMMUNITY): Payer: Self-pay | Admitting: Emergency Medicine

## 2016-10-28 DIAGNOSIS — Z7982 Long term (current) use of aspirin: Secondary | ICD-10-CM | POA: Diagnosis not present

## 2016-10-28 DIAGNOSIS — F17210 Nicotine dependence, cigarettes, uncomplicated: Secondary | ICD-10-CM | POA: Diagnosis not present

## 2016-10-28 DIAGNOSIS — L0201 Cutaneous abscess of face: Secondary | ICD-10-CM | POA: Diagnosis not present

## 2016-10-28 MED ORDER — SULFAMETHOXAZOLE-TRIMETHOPRIM 800-160 MG PO TABS: 1.0000 | ORAL_TABLET | Freq: Two times a day (BID) | ORAL | 0 refills | Status: AC

## 2016-10-28 MED ORDER — LIDOCAINE HCL (PF) 1 % IJ SOLN
5.0000 mL | Freq: Once | INTRAMUSCULAR | Status: AC
Start: 2016-10-28 — End: 2016-10-28
  Administered 2016-10-28: 5 mL
  Filled 2016-10-28: qty 5

## 2016-10-28 NOTE — ED Triage Notes (Signed)
Patient states L jaw abcess.  Not inside mouth.  Patient has hardened area with redness/swelling to upper jaw.  Denies other problems.

## 2016-10-28 NOTE — Discharge Instructions (Signed)
Medications: Bactrim  Treatment: Take Bactrim as prescribed for 1 week. Leave the dressing applied today on until this time tomorrow. Wash the wound with warm soapy water, dry, and apply antibiotic ointment and clean dressing.  Follow-up: Please return in 2 days for wound recheck. Please return to the emergency department if you develop increasing pain, redness, swelling, spreading of the hard area, or any other concerning symptoms.

## 2016-10-28 NOTE — ED Notes (Signed)
Coupon given to patent.

## 2016-10-28 NOTE — ED Provider Notes (Signed)
[facility] Provider Note   CSN: [id number] Arrival date & time: 10/28/16  0825  By signing my name below, I, Placido Sou, attest that this documentation has been prepared under the direction and in the presence of Buel Ream, PA-C.  Electronically Signed: Placido Sou, ED Scribe. 10/28/16. 9:27 AM.   History   Chief Complaint Chief Complaint  Patient presents with  . Abscess   HPI HPI Comments: Stephen Nielsen is a 45 y.o. male who presents to the Emergency Department complaining of a worsening, moderate, point of swelling across his left cheek x 1 day. He states his swelling has progressively worsened across his left cheek with mild associated redness but denies any associated facial pain, dental pain, fevers, chills, SOB, abdominal pain, CP, nausea, vomiting, or other associated symptoms at this time. Pt reports a h/o dental abscesses but denies having ever experienced similar symptoms in which he experienced facial swelling without pain. He denies pain in his mouth.   The history is provided by the patient. No language interpreter was used.    History reviewed. No pertinent past medical history.  There are no active problems to display for this patient.   History reviewed. No pertinent surgical history.     Home Medications    Prior to Admission medications   Medication Sig Start Date End Date Taking? Authorizing Provider  aspirin-sod bicarb-citric acid (ALKA-SELTZER) 325 MG TBEF tablet Take 325 mg by mouth every 6 (six) hours as needed (flu like symptoms).    Historical Provider, MD  diphenhydrAMINE (BENADRYL) 25 MG tablet Take 1 tablet (25 mg total) by mouth every 6 (six) hours. Patient not taking: Reported on 04/23/2016 02/03/16   Mancel Bale, MD  famotidine (PEPCID) 20 MG tablet Take 1 tablet (20 mg total) by mouth 2 (two) times daily. Patient not taking: Reported on 04/23/2016 02/03/16   Mancel Bale, MD  predniSONE (DELTASONE) 20 MG tablet  Take 1 tablet (20 mg total) by mouth 2 (two) times daily. Patient not taking: Reported on 04/23/2016 02/03/16   Mancel Bale, MD  sulfamethoxazole-trimethoprim (BACTRIM DS,SEPTRA DS) 800-160 MG tablet Take 1 tablet by mouth 2 (two) times daily. 10/28/16 11/04/16  Emi Holes, PA-C    Family History No family history on file.  Social History Social History  Substance Use Topics  . Smoking status: Current Every Day Smoker    Packs/day: 0.50    Types: Cigarettes  . Smokeless tobacco: Not on file  . Alcohol use Yes     Comment: occassional     Allergies   Patient has no known allergies.   Review of Systems Review of Systems  Constitutional: Negative for chills and fever.  HENT: Positive for facial swelling. Negative for dental problem and sore throat.   Respiratory: Negative for shortness of breath.   Cardiovascular: Negative for chest pain.  Gastrointestinal: Negative for abdominal pain, nausea and vomiting.  Genitourinary: Negative for dysuria.  Musculoskeletal: Negative for back pain.  Skin: Positive for color change. Negative for rash and wound.  Neurological: Negative for headaches.  Psychiatric/Behavioral: The patient is not nervous/anxious.    Physical Exam Updated Vital Signs BP 141/79 (BP Location: Left Arm)   Pulse 78   Temp 99 F (37.2 C) (Oral)   Resp 18   SpO2 100%   Physical Exam  Constitutional: He appears well-developed and well-nourished. No distress.  HENT:  Head: Normocephalic and atraumatic.    Mouth/Throat: Oropharynx is clear and moist. No oropharyngeal exudate.  Eyes:  Conjunctivae are normal. Pupils are equal, round, and reactive to light. Right eye exhibits no discharge. Left eye exhibits no discharge. No scleral icterus.  Neck: Normal range of motion. Neck supple. No thyromegaly present.  Cardiovascular: Normal rate, regular rhythm, normal heart sounds and intact distal pulses.  Exam reveals no gallop and no friction rub.   No murmur  heard. Pulmonary/Chest: Effort normal and breath sounds normal. No stridor. No respiratory distress. He has no wheezes. He has no rales.  Abdominal: Soft. Bowel sounds are normal. He exhibits no distension. There is no tenderness. There is no rebound and no guarding.  Musculoskeletal: He exhibits no edema.  Lymphadenopathy:    He has no cervical adenopathy.  Neurological: He is alert. Coordination normal.  Skin: Skin is warm and dry. No rash noted. He is not diaphoretic. No pallor.  Psychiatric: He has a normal mood and affect.  Nursing note and vitals reviewed.   ED Treatments / Results  Labs (all labs ordered are listed, but only abnormal results are displayed) Labs Reviewed - No data to display  EKG  EKG Interpretation None       Radiology No results found.  Procedures .Marland Kitchen.Incision and Drainage Date/Time: 10/28/2016 9:58 AM Performed by: Emi HolesLAW, Jaquel Coomer M Authorized by: Emi HolesLAW, Mclean Moya M   Consent:    Consent obtained:  Verbal   Consent given by:  Patient   Risks discussed:  Pain   Alternatives discussed:  No treatment Location:    Type:  Abscess   Size:  1 cm x 0.5 cm    Location:  Head   Head location:  Face Pre-procedure details:    Skin preparation:  Betadine Anesthesia (see MAR for exact dosages):    Anesthesia method:  Local infiltration   Local anesthetic:  Lidocaine 1% w/o epi Procedure type:    Complexity:  Simple Procedure details:    Needle aspiration: no     Incision types:  Stab incision   Scalpel blade:  11   Drainage:  Purulent (sebacous)   Drainage amount:  Moderate   Wound treatment:  Wound left open   Packing materials:  None Post-procedure details:    Patient tolerance of procedure:  Tolerated well, no immediate complications      DIAGNOSTIC STUDIES: Oxygen Saturation is 97% on RA, normal by my interpretation.    COORDINATION OF CARE: 9:27 AM Discussed next steps with pt. Pt verbalized understanding and is agreeable with the plan.     EMERGENCY DEPARTMENT US SOFT TISSUE INTERPRETATION "Study: Limited Ultrasound of the noted body part in comments below"  INDICATIONS: Soft tissue infection Multiple views of the body part are obtained with a multi-frequency linear probe  PERFORMED BY:  Myself  IMAGES ARCHIVED?: Yes  SIDE:Left  BODY PART:Other soft tisse (comment in note) Head  FINDINGS: Abcess present  LIMITATIONS: none  INTERPRETATION:  Abcess present  COMMENT:      Medications Ordered in ED Medications  lidocaine (PF) (XYLOCAINE) 1 % injection 5 mL (5 mLs Infiltration Given by Other 10/28/16 [id number])    Initial Impression / Assessment and Plan / ED Course  I have reviewed the triage vital signs and the nursing notes.  Pertinent labs & imaging results that were available during my care of the patient were reviewed by me and considered in my medical decision making (see chart for details).  Clinical Course     Patient with skin abscess and/or sebaceous cyst. Incision and drainage performed in the ED today with a combination  of pus and sebaceous drainage.  Abscess was not large enough to warrant packing or drain placement. Wound recheck in 2 days. Supportive care and return precautions discussed.  Pt sent home with Bactrim. Patient understands and agrees with plan. Patient also evaluated by Dr. Eudelia Bunchardama who guided the patient's management and agrees with plan.  I personally performed the services described in this documentation, which was scribed in my presence. The recorded information has been reviewed and is accurate.  Final Clinical Impressions(s) / ED Diagnoses   Final diagnoses:  Facial abscess    New Prescriptions Discharge Medication List as of 10/28/2016 11:10 AM    START taking these medications   Details  sulfamethoxazole-trimethoprim (BACTRIM DS,SEPTRA DS) 800-160 MG tablet Take 1 tablet by mouth 2 (two) times daily., Starting Tue 10/28/2016, Until Tue 11/04/2016, Print           Emi Holeslexandra M Margit Batte, PA-C 10/29/16 1649    Nira ConnPedro Eduardo Cardama, MD 10/30/16 [phone]

## 2016-10-28 NOTE — Discharge Planning (Signed)
[facility]  Pt advised to call number on insurance card for a list of PCP accepting new patients.

## 2016-12-24 ENCOUNTER — Emergency Department (HOSPITAL_COMMUNITY)
Admission: EM | Admit: 2016-12-24 | Discharge: 2016-12-24 | Disposition: A | Discharge status: Home / Self Care | Payer: BLUE CROSS/BLUE SHIELD | Attending: Emergency Medicine | Admitting: Emergency Medicine

## 2016-12-24 ENCOUNTER — Encounter (HOSPITAL_COMMUNITY): Payer: Self-pay | Admitting: *Deleted

## 2016-12-24 DIAGNOSIS — W57XXXA Bitten or stung by nonvenomous insect and other nonvenomous arthropods, initial encounter: Secondary | ICD-10-CM | POA: Insufficient documentation

## 2016-12-24 DIAGNOSIS — Z7982 Long term (current) use of aspirin: Secondary | ICD-10-CM | POA: Diagnosis not present

## 2016-12-24 DIAGNOSIS — S60561A Insect bite (nonvenomous) of right hand, initial encounter: Secondary | ICD-10-CM | POA: Insufficient documentation

## 2016-12-24 DIAGNOSIS — T148XXA Other injury of unspecified body region, initial encounter: Secondary | ICD-10-CM

## 2016-12-24 DIAGNOSIS — Z79899 Other long term (current) drug therapy: Secondary | ICD-10-CM | POA: Insufficient documentation

## 2016-12-24 DIAGNOSIS — Y999 Unspecified external cause status: Secondary | ICD-10-CM | POA: Diagnosis not present

## 2016-12-24 DIAGNOSIS — Y9389 Activity, other specified: Secondary | ICD-10-CM | POA: Diagnosis not present

## 2016-12-24 DIAGNOSIS — Y929 Unspecified place or not applicable: Secondary | ICD-10-CM | POA: Insufficient documentation

## 2016-12-24 DIAGNOSIS — F17210 Nicotine dependence, cigarettes, uncomplicated: Secondary | ICD-10-CM | POA: Diagnosis not present

## 2016-12-24 MED ORDER — HYDROCORTISONE 1 % EX CREA: TOPICAL_CREAM | CUTANEOUS | 0 refills | Status: DC

## 2016-12-24 NOTE — Discharge Instructions (Signed)
Keep these areas clean and dry. You may apply hydrocortisone cream to the areas for itching. Please also apply cool compresses to help with itching. These do not appear infected, therefore we do not need antibiotics at this time. However return to the emergency department if you notice pus, warmth, or increased redness. Please follow-up with your primary care physician. Return to the emergency department for any new or concerning symptoms.

## 2016-12-24 NOTE — ED Notes (Signed)
Declined W/C at D/C and was escorted to lobby by RN. 

## 2016-12-24 NOTE — ED Triage Notes (Signed)
Pt reports multiple "bug bites" to rt hand with some swelling noted.

## 2016-12-24 NOTE — ED Provider Notes (Signed)
[facility]-EMERG[facility]Y DEPT Provider Note   CSN: [id number] Arrival date & time: 12/24/16  1200  By signing my name below, I, Majel HomerPeyton Lee, attest that this documentation has been prepared under the direction and in the presence of Cheri FowlerKayla Antavius Sperbeck, PA-C . Electronically Signed: Majel HomerPeyton Lee, Scribe. 12/24/2016. 1:36 PM.  History   Chief Complaint Chief Complaint  Patient presents with  . Insect Bite   The history is provided by the patient. No language interpreter was used.   HPI Comments: Stephen Nielsen is a 46 y.o. male who presents to the Emergency Department for an evaluation of multiple possible "bug bites" to his right hand that appeared 2 days ago. Pt reports he was "hunting squirrels" in the woods 2 days ago when he noticed pruritic "bumps and scratches to his hands upon returning home. He denies seeing any bug or animal bites him while hunting. Pt reports associated right hand swelling. He states his symptoms are worse at night. He notes he has taken Advil with mild relief of swelling. Pt denies any fever, purulent drainage, worsening of symptoms, numbness, or weakness.   Pt reports hx of chronic back pain for which he is requesting a referral.   History reviewed. No pertinent past medical history.  There are no active problems to display for this patient.  History reviewed. No pertinent surgical history.  Home Medications    Prior to Admission medications   Medication Sig Start Date End Date Taking? Authorizing Provider  aspirin-sod bicarb-citric acid (ALKA-SELTZER) 325 MG TBEF tablet Take 325 mg by mouth every 6 (six) hours as needed (flu like symptoms).    Historical Provider, MD  diphenhydrAMINE (BENADRYL) 25 MG tablet Take 1 tablet (25 mg total) by mouth every 6 (six) hours. Patient not taking: Reported on 04/23/2016 02/03/16   Mancel BaleElliott Wentz, MD  famotidine (PEPCID) 20 MG tablet Take 1 tablet (20 mg total) by mouth 2 (two) times daily. Patient not taking: Reported on 04/23/2016 02/03/16    Mancel BaleElliott Wentz, MD  hydrocortisone cream 1 % Apply to affected area 2 times daily 12/24/16   Cheri FowlerKayla Jomel Whittlesey, PA-C  predniSONE (DELTASONE) 20 MG tablet Take 1 tablet (20 mg total) by mouth 2 (two) times daily. Patient not taking: Reported on 04/23/2016 02/03/16   Mancel BaleElliott Wentz, MD    Family History No family history on file.  Social History Social History  Substance Use Topics  . Smoking status: Current Every Day Smoker    Packs/day: 0.50    Types: Cigarettes  . Smokeless tobacco: Not on file  . Alcohol use Yes     Comment: occassional     Allergies   Patient has no known allergies.   Review of Systems Review of Systems 10 systems reviewed and all are negative for acute change except as noted in the HPI.  Physical Exam Updated Vital Signs BP 136/78 (BP Location: Left Arm)   Pulse 90   Temp 98.3 F (36.8 C) (Oral)   Resp 16   SpO2 99%   Physical Exam  Constitutional: He is oriented to person, place, and time. He appears well-developed and well-nourished.  HENT:  Head: Normocephalic and atraumatic.  Right Ear: External ear normal.  Left Ear: External ear normal.  Eyes: Conjunctivae are normal. No scleral icterus.  Neck: No tracheal deviation present.  Cardiovascular:  Brisk capillary refill.   Pulmonary/Chest: Effort normal. No respiratory distress.  Abdominal: He exhibits no distension.  Musculoskeletal: Normal range of motion. He exhibits no tenderness.  Neurological: He is  alert and oriented to person, place, and time.  Normal strength and sensation throughout bilateral upper extremities.   Skin: Skin is warm and dry.  Scattered punctate areas of erythema with evidence of excoriation.  No purulence, warmth, induration, or fluctuance.   Psychiatric: He has a normal mood and affect. His behavior is normal.   ED Treatments / Results  Labs (all labs ordered are listed, but only abnormal results are displayed) Labs Reviewed - No data to display  EKG  EKG  Interpretation None       Radiology No results found.  Procedures Procedures (including critical care time)  Medications Ordered in ED Medications - No data to display  DIAGNOSTIC STUDIES:  Oxygen Saturation is 99% on RA, normal by my interpretation.    COORDINATION OF CARE:  1:32 PM Discussed treatment plan with pt at bedside and pt agreed to plan.  Initial Impression / Assessment and Plan / ED Course  I have reviewed the triage vital signs and the nursing notes.  Pertinent labs & imaging results that were available during my care of the patient were reviewed by me and considered in my medical decision making (see chart for details).  Clinical Course    Patient presents with concern for possible bug bites. Exam appears like possible scratches. No signs of infection. No systemic symptoms. Patient is allergic to Benadryl. We'll discharge home with hydrocortisone cream and recommend cool compresses for itching. Advised avoiding scratching. Discussed return precautions including signs of infection. Patient also asked for referral for his chronic neck and back issues. He denies any new symptoms for this.  I personally performed the services described in this documentation, which was scribed in my presence. The recorded information has been reviewed and is accurate.   Final Clinical Impressions(s) / ED Diagnoses   Final diagnoses:  Scratches  Insect bite, initial encounter    New Prescriptions New Prescriptions   HYDROCORTISONE CREAM 1 %    Apply to affected area 2 times daily     Cheri Fowler, PA-C 12/24/16 1341    Derwood Kaplan, MD 12/24/16 1606

## 2017-08-02 ENCOUNTER — Emergency Department (HOSPITAL_COMMUNITY): Payer: BLUE CROSS/BLUE SHIELD

## 2017-08-02 ENCOUNTER — Emergency Department (HOSPITAL_COMMUNITY)
Admission: EM | Admit: 2017-08-02 | Discharge: 2017-08-02 | Disposition: A | Discharge status: Home / Self Care | Payer: BLUE CROSS/BLUE SHIELD | Attending: Emergency Medicine | Admitting: Emergency Medicine

## 2017-08-02 ENCOUNTER — Encounter (HOSPITAL_COMMUNITY): Payer: Self-pay | Admitting: Emergency Medicine

## 2017-08-02 DIAGNOSIS — Z23 Encounter for immunization: Secondary | ICD-10-CM | POA: Diagnosis not present

## 2017-08-02 DIAGNOSIS — L089 Local infection of the skin and subcutaneous tissue, unspecified: Secondary | ICD-10-CM | POA: Diagnosis not present

## 2017-08-02 DIAGNOSIS — M79672 Pain in left foot: Secondary | ICD-10-CM

## 2017-08-02 DIAGNOSIS — F17210 Nicotine dependence, cigarettes, uncomplicated: Secondary | ICD-10-CM | POA: Diagnosis not present

## 2017-08-02 DIAGNOSIS — B353 Tinea pedis: Secondary | ICD-10-CM

## 2017-08-02 MED ORDER — AMOXICILLIN-POT CLAVULANATE 875-125 MG PO TABS: 1.0000 | ORAL_TABLET | Freq: Two times a day (BID) | ORAL | 0 refills | Status: AC

## 2017-08-02 MED ORDER — TETANUS-DIPHTH-ACELL PERTUSSIS 5-2.5-18.5 LF-MCG/0.5 IM SUSP
0.5000 mL | Freq: Once | INTRAMUSCULAR | Status: AC
  Administered 2017-08-02: 0.5 mL via INTRAMUSCULAR
  Filled 2017-08-02: qty 0.5

## 2017-08-02 MED ORDER — CIPROFLOXACIN HCL 500 MG PO TABS: 500.0000 mg | ORAL_TABLET | Freq: Two times a day (BID) | ORAL | 0 refills | Status: AC

## 2017-08-02 MED ORDER — IBUPROFEN 800 MG PO TABS
800.0000 mg | ORAL_TABLET | Freq: Once | ORAL | Status: AC
  Administered 2017-08-02: 800 mg via ORAL
  Filled 2017-08-02: qty 1

## 2017-08-02 MED ORDER — BUTENAFINE HCL 1 % EX CREA: 1.0000 "application " | TOPICAL_CREAM | Freq: Two times a day (BID) | CUTANEOUS | 0 refills | Status: AC

## 2017-08-02 NOTE — ED Triage Notes (Signed)
Pt states yesterday he thinks he somehow hit his foot on the bed or something and woke up this morning. Pt toe on left foot is red and swollen with streaking up his foot.

## 2017-08-02 NOTE — ED Provider Notes (Signed)
[facility] Provider Note   CSN: [id number] Arrival date & time: 08/02/17  4098     History   Chief Complaint Chief Complaint  Patient presents with  . Foot Pain    HPI Stephen Nielsen is a 46 y.o. male presenting with sudden onset left foot erythema and pain upon awakening this morning. Patient reports that he was at a water park all day yesterday and hit his foot on a pool drain but didn't think too much of it for the rest of the day. He then hit the same toe on the bed last night.he woke up this morning and his fourth toe was swollen, erythematous with erythema coming up the dorsum of the foot and tenderness. He denies any fever, chills, nausea, vomiting, numbness, weakness.  HPI  History reviewed. No pertinent past medical history.  There are no active problems to display for this patient.   History reviewed. No pertinent surgical history.     Home Medications    Prior to Admission medications   Medication Sig Start Date End Date Taking? Authorizing Provider  amoxicillin-clavulanate (AUGMENTIN) 875-125 MG tablet Take 1 tablet by mouth 2 (two) times daily. 08/02/17 08/09/17  Mathews Robinsons B, PA-C  aspirin-sod bicarb-citric acid (ALKA-SELTZER) 325 MG TBEF tablet Take 325 mg by mouth every 6 (six) hours as needed (flu like symptoms).    [provider]  Butenafine HCl 1 % cream Apply 1 application topically 2 (two) times daily. 08/02/17 08/12/17  Mathews Robinsons B, PA-C  ciprofloxacin (CIPRO) 500 MG tablet Take 1 tablet (500 mg total) by mouth every 12 (twelve) hours. 08/02/17 08/09/17  Georgiana Shore, PA-C  diphenhydrAMINE (BENADRYL) 25 MG tablet Take 1 tablet (25 mg total) by mouth every 6 (six) hours. Patient not taking: Reported on 04/23/2016 02/03/16   Mancel Bale, MD  famotidine (PEPCID) 20 MG tablet Take 1 tablet (20 mg total) by mouth 2 (two) times daily. Patient not taking: Reported on 04/23/2016 02/03/16   Mancel Bale, MD  hydrocortisone  cream 1 % Apply to affected area 2 times daily 12/24/16   Cheri Fowler, PA-C  predniSONE (DELTASONE) 20 MG tablet Take 1 tablet (20 mg total) by mouth 2 (two) times daily. Patient not taking: Reported on 04/23/2016 02/03/16   Mancel Bale, MD    Family History No family history on file.  Social History Social History  Substance Use Topics  . Smoking status: Current Every Day Smoker    Packs/day: 0.50    Types: Cigarettes  . Smokeless tobacco: Not on file  . Alcohol use Yes     Comment: occassional     Allergies   Patient has no known allergies.   Review of Systems Review of Systems  Constitutional: Negative for chills and fever.  Respiratory: Negative for cough, shortness of breath, wheezing and stridor.   Cardiovascular: Negative for chest pain, palpitations and leg swelling.  Gastrointestinal: Negative for abdominal pain, nausea and vomiting.  Genitourinary: Negative for difficulty urinating, dysuria and hematuria.  Musculoskeletal: Positive for arthralgias, joint swelling and myalgias. Negative for back pain, gait problem, neck pain and neck stiffness.  Skin: Positive for color change and wound. Negative for pallor and rash.  Neurological: Negative for dizziness, seizures, syncope, weakness and numbness.     Physical Exam Updated Vital Signs BP (!) 143/88 (BP Location: Left Arm)   Pulse 79   Temp 98.5 F (36.9 C) (Oral)   Resp 17   Ht 5\' 9"  (1.753 m)   Wt  74.8 kg (165 lb)   SpO2 100%   BMI 24.37 kg/m   Physical Exam  Constitutional: He appears well-developed and well-nourished. No distress.  Afebrile, nontoxic-appearing, sitting comfortably in chair in no acute distress.  HENT:  Head: Normocephalic and atraumatic.  Eyes: Conjunctivae and EOM are normal.  Neck: Normal range of motion.  Cardiovascular: Normal rate, regular rhythm, normal heart sounds and intact distal pulses.   No murmur heard. Pulmonary/Chest: Effort normal and breath sounds normal. No  respiratory distress. He has no wheezes. He has no rales.  Abdominal: He exhibits no distension.  Musculoskeletal: Normal range of motion. He exhibits edema and tenderness. He exhibits no deformity.  Mild edema to the fourth left toe with erythema traveling up along the dorsum of the foot. Full range of motion, 5 out of 5 strength to flexion and extension of the toes. Strong dorsalis pedis pulses, sensation intact. On close examination patient has macerated moist skin between the fourth and third toe and third and second.   Neurological: He is alert. No sensory deficit.  Neurovascularly intact   Skin: Skin is warm and dry. No rash noted. He is not diaphoretic. There is erythema. No pallor.  Psychiatric: He has a normal mood and affect.  Nursing note and vitals reviewed.    ED Treatments / Results  Labs (all labs ordered are listed, but only abnormal results are displayed) Labs Reviewed - No data to display  EKG  EKG Interpretation None       Radiology Dg Foot Complete Left  Result Date: 08/02/2017 CLINICAL DATA:  Swelling of the fourth toe. EXAM: LEFT FOOT - COMPLETE 3+ VIEW COMPARISON:  None. FINDINGS: The lucency at the base of the fourth middle phalanx on only the AP view is most likely a prominent nutrient foramen. No soft tissue abnormalities. No foreign body or soft tissue gas. No other abnormalities. IMPRESSION: No convincing evidence of fracture. Lucency at the base of the fourth middle phalanx is favored to represent a nutrient foramen, only seen on one view. Recommend clinical correlation in this region. No soft tissue abnormalities. Electronically Signed   By: Gerome Sam III M.D   On: 08/02/2017 08:00    Procedures Procedures (including critical care time)  Medications Ordered in ED Medications  ibuprofen (ADVIL,MOTRIN) tablet 800 mg (800 mg Oral Given 08/02/17 0945)  Tdap (BOOSTRIX) injection 0.5 mL (0.5 mLs Intramuscular Given 08/02/17 0945)     Initial  Impression / Assessment and Plan / ED Course  I have reviewed the triage vital signs and the nursing notes.  Pertinent labs & imaging results that were available during my care of the patient were reviewed by me and considered in my medical decision making (see chart for details).    Patient presenting with sudden onset left fourth toe erythema and swelling with tenderness palpation after injuring his toe inappropriate water park and subsequently hitting his toe on the side of the bed last night.  On close examination, noted two small reports of injury between the toes with moist macerated skin concerning for tinea pedis.  Will treat with antibiotics and have patient follow up with podiatry as needed. Patient does not have a PCP.  He is otherwise well appearing, non-toxic with normal vitals and stable.  Wounds were irrigated and pain managed. Tetanus updated  Discharge home with antibiotics and instructions on topical medication of antifungal. he was advised to keep his feet clean and thoroughly dry. Podiatry follow-up as needed.  Discussed strict return  precautions and advised to return to the emergency department if experiencing any new or worsening symptoms. Instructions were understood and patient agreed with discharge plan. Patient was discussed with Dr. Donnald Garre who has seen patient and agrees with assessment and plan. Final Clinical Impressions(s) / ED Diagnoses   Final diagnoses:  Tinea pedis of left foot  Foot pain, left  Left foot infection    New Prescriptions New Prescriptions   AMOXICILLIN-CLAVULANATE (AUGMENTIN) 875-125 MG TABLET    Take 1 tablet by mouth 2 (two) times daily.   BUTENAFINE HCL 1 % CREAM    Apply 1 application topically 2 (two) times daily.   CIPROFLOXACIN (CIPRO) 500 MG TABLET    Take 1 tablet (500 mg total) by mouth every 12 (twelve) hours.     Georgiana Shore, PA-C 08/02/17 1057    Arby Barrette, MD 08/05/17 1728

## 2017-08-02 NOTE — ED Triage Notes (Signed)
Pt states the top of his L foot also hurts. States hes unsure of injury just woke up like this.

## 2017-08-02 NOTE — ED Provider Notes (Signed)
Medical screening examination/treatment/procedure(s) were conducted as a shared visit with non-physician practitioner(s) and myself.  I personally evaluated the patient during the encounter.   EKG Interpretation None     Patient noted for redness and swelling this morning. He has no history of similar. He is otherwise healthy. On examination patient has very localized tinea pedis between the second and third and fourth and third digits. He has erythema of those 2 toes as well as a portion of the forefoot. She generally appears to have very good foot hygiene and condition of the legs. He does not have diabetes or peripheral as her disease. It appears that he has site of entry of infection at these tinea pedis lesions. Will need to treat for cellulitis and tinea pedis. I agree with plan of management.   Arby Barrette, MD 08/05/17 [phone]

## 2017-08-02 NOTE — Discharge Instructions (Signed)
As discussed, take your entire course of antibiotics even if you feel better. Make sure to keep your feet clean and dry thoroughly. Apply nystatin between your affected toes and gauze to keep your toes separated and dry.  Follow-up with podiatry as needed. Return if you experience worsening swelling, redness, purulence, fever, chills, or any other new concerning symptoms in the meantime.

## 2017-08-02 NOTE — ED Notes (Signed)
Declined W/C at D/C and was escorted to lobby by RN. 

## 2017-10-05 ENCOUNTER — Encounter (HOSPITAL_COMMUNITY): Payer: Self-pay

## 2017-10-05 ENCOUNTER — Emergency Department (HOSPITAL_COMMUNITY)
Admission: EM | Admit: 2017-10-05 | Discharge: 2017-10-05 | Disposition: A | Discharge status: Home / Self Care | Payer: BLUE CROSS/BLUE SHIELD | Attending: Emergency Medicine | Admitting: Emergency Medicine

## 2017-10-05 ENCOUNTER — Emergency Department (HOSPITAL_COMMUNITY): Payer: BLUE CROSS/BLUE SHIELD

## 2017-10-05 DIAGNOSIS — R05 Cough: Secondary | ICD-10-CM | POA: Diagnosis present

## 2017-10-05 DIAGNOSIS — F17210 Nicotine dependence, cigarettes, uncomplicated: Secondary | ICD-10-CM | POA: Diagnosis not present

## 2017-10-05 DIAGNOSIS — B9789 Other viral agents as the cause of diseases classified elsewhere: Secondary | ICD-10-CM | POA: Insufficient documentation

## 2017-10-05 DIAGNOSIS — J069 Acute upper respiratory infection, unspecified: Secondary | ICD-10-CM | POA: Insufficient documentation

## 2017-10-05 MED ORDER — ALBUTEROL SULFATE HFA 108 (90 BASE) MCG/ACT IN AERS
2.0000 | INHALATION_SPRAY | Freq: Once | RESPIRATORY_TRACT | Status: AC
  Administered 2017-10-05: 2 via RESPIRATORY_TRACT
  Filled 2017-10-05: qty 6.7

## 2017-10-05 MED ORDER — DEXAMETHASONE SODIUM PHOSPHATE 10 MG/ML IJ SOLN
10.0000 mg | Freq: Once | INTRAMUSCULAR | Status: AC
  Administered 2017-10-05: 10 mg via INTRAMUSCULAR
  Filled 2017-10-05: qty 1

## 2017-10-05 MED ORDER — BENZONATATE 100 MG PO CAPS: 100.0000 mg | ORAL_CAPSULE | Freq: Three times a day (TID) | ORAL | 0 refills | Status: DC | PRN

## 2017-10-05 NOTE — Discharge Instructions (Signed)
It was my pleasure taking care of you today!   Your symptoms are likely due to a viral upper respiratory infection. Fortunately, we did not see evidence of serious infection and can treat your symptoms. Use inhaler every 4-6 hours as needed for cough, shortness of breath. Flonase and mucinex for nasal congestion, tessalon as needed for cough. Alternate between Tylenol and ibuprofen as needed for pain.   Rest, drink plenty of fluids to be sure you are staying hydrated.   Please follow up with your primary doctor for discussion of your diagnoses and further evaluation after today's visit if symptoms persist longer than 7 days; Return to the ER for high fevers, difficulty breathing or other concerning symptoms   To find a primary care or specialty doctor please call [phone] or [phone] to access "[facility] Find a Doctor Service."  You may also go on the [facility] website at [email or web address]  There are also multiple [facility] and [facility] practices throughout the [place] that are frequently accepting new patients. You may find a clinic that is close to your home and contact them.  [facility] - [address] [place]  [phone]  [facility] in [place] (also locations in [place] and [place]) - [address] [place] [place] [phone]  [facility] - [address] [place] [phone]

## 2017-10-05 NOTE — ED Provider Notes (Signed)
[facility] Provider Note   CSN: [id number] Arrival date & time: 10/05/17  0831     History   Chief Complaint Chief Complaint  Patient presents with  . URI    HPI Stephen Nielsen is a 46 y.o. male.  The history is provided by the patient and medical records. No language interpreter was used.  URI   Associated symptoms include congestion and cough. Pertinent negatives include no sore throat and no wheezing.   Stephen Nielsen is an otherwise healthy 46 y.o. male who presents to the Emergency Department complaining of persistent nasal congestion, cough productive of yellow sputum x 1 week. Associated with fatigue and rib cage pain which is worse with coughing. He has tried OTC pain relievers with no improvement. Denies alleviating factors. No fevers, chills, abdominal pain, nausea, vomiting, diarrhea, back pain shortness of breath or chest pain. No known sick contacts. + current every day smoker. Denies hx of asthma or COPD. History reviewed. No pertinent past medical history.  There are no active problems to display for this patient.   History reviewed. No pertinent surgical history.     Home Medications    Prior to Admission medications   Medication Sig Start Date End Date Taking? Authorizing Provider  aspirin-sod bicarb-citric acid (ALKA-SELTZER) 325 MG TBEF tablet Take 325 mg by mouth every 6 (six) hours as needed (flu like symptoms).    [provider]  benzonatate (TESSALON) 100 MG capsule Take 1 capsule (100 mg total) by mouth 3 (three) times daily as needed for cough. 10/05/17   Kennis Buell, Chase PicketJaime Pilcher, PA-C  diphenhydrAMINE (BENADRYL) 25 MG tablet Take 1 tablet (25 mg total) by mouth every 6 (six) hours. Patient not taking: Reported on 04/23/2016 02/03/16   Mancel BaleWentz, Elliott, MD  famotidine (PEPCID) 20 MG tablet Take 1 tablet (20 mg total) by mouth 2 (two) times daily. Patient not taking: Reported on 04/23/2016 02/03/16   Mancel BaleWentz,  Elliott, MD  hydrocortisone cream 1 % Apply to affected area 2 times daily Patient not taking: Reported on 10/05/2017 12/24/16   Cheri Fowlerose, Kayla, PA-C  predniSONE (DELTASONE) 20 MG tablet Take 1 tablet (20 mg total) by mouth 2 (two) times daily. Patient not taking: Reported on 04/23/2016 02/03/16   Mancel BaleWentz, Elliott, MD    Family History History reviewed. No pertinent family history.  Social History Social History  Substance Use Topics  . Smoking status: Current Every Day Smoker    Packs/day: 0.50    Types: Cigarettes  . Smokeless tobacco: Never Used  . Alcohol use Yes     Comment: occassional     Allergies   Patient has no known allergies.   Review of Systems Review of Systems  Constitutional: Positive for fatigue.  HENT: Positive for congestion. Negative for sore throat.   Respiratory: Positive for cough. Negative for shortness of breath and wheezing.   Musculoskeletal: Positive for myalgias.  All other systems reviewed and are negative.    Physical Exam Updated Vital Signs BP (!) 147/89 (BP Location: Right Arm)   Pulse 94   Temp 98.9 F (37.2 C) (Oral)   Resp 17   SpO2 97%   Physical Exam  Constitutional: He is oriented to person, place, and time. He appears well-developed and well-nourished. No distress.  HENT:  Head: Normocephalic and atraumatic.  OP with erythema, no exudates or tonsillar hypertrophy. + nasal congestion with mucosal edema.   Neck: Normal range of motion. Neck supple.  Cardiovascular: Normal rate,  regular rhythm and normal heart sounds.   Pulmonary/Chest: Effort normal.  Lungs are clear to auscultation bilaterally - no w/r/r  Abdominal: Soft. He exhibits no distension. There is no tenderness.  Musculoskeletal: Normal range of motion.  Neurological: He is alert and oriented to person, place, and time.  Skin: Skin is warm and dry. He is not diaphoretic.  Nursing note and vitals reviewed.    ED Treatments / Results  Labs (all labs ordered are  listed, but only abnormal results are displayed) Labs Reviewed - No data to display  EKG  EKG Interpretation None       Radiology Dg Chest 2 View  Result Date: 10/05/2017 CLINICAL DATA:  Cough. EXAM: CHEST  2 VIEW COMPARISON:  Radiographs of February 03, 2016. FINDINGS: The heart size and mediastinal contours are within normal limits. Both lungs are clear. No pneumothorax or pleural effusion is noted. The visualized skeletal structures are unremarkable. IMPRESSION: No active cardiopulmonary disease. Electronically Signed   By: Lupita Raider, M.D.   On: 10/05/2017 09:45    Procedures Procedures (including critical care time)  Medications Ordered in ED Medications  dexamethasone (DECADRON) injection 10 mg (not administered)  albuterol (PROVENTIL HFA;VENTOLIN HFA) 108 (90 Base) MCG/ACT inhaler 2 puff (not administered)     Initial Impression / Assessment and Plan / ED Course  I have reviewed the triage vital signs and the nursing notes.  Pertinent labs & imaging results that were available during my care of the patient were reviewed by me and considered in my medical decision making (see chart for details).    Stephen Nielsen is a 46 y.o. male who presents to ED for cough, congestion x 1 week.   On exam, patient is afebrile, non-toxic appearing with a clear lung exam. Mild rhinorrhea and OP with erythema but no exudates or tonsillar hypertrophy.  CXR negative.   Sxs today likely due to viral URI. Symptomatic home care instructions discussed. Decadron and albuterol inhaler given in ED. Rx for Tessalon given. PCP follow up strongly encouraged if symptoms persist. Reasons to return to ER discussed. All questions answered.   Blood pressure (!) 147/89, pulse 94, temperature 98.9 F (37.2 C), temperature source Oral, resp. rate 17, SpO2 97 %  Final Clinical Impressions(s) / ED Diagnoses   Final diagnoses:  Viral URI with cough    New Prescriptions New Prescriptions    BENZONATATE (TESSALON) 100 MG CAPSULE    Take 1 capsule (100 mg total) by mouth 3 (three) times daily as needed for cough.     [facility], Chase Picket, PA-C 10/05/17 1008    Pricilla Loveless, MD 10/06/17 [phone]

## 2017-10-05 NOTE — ED Notes (Signed)
Pt in xray

## 2017-10-05 NOTE — ED Triage Notes (Signed)
Pt states generalized body aches, cough, congestion X1 week, He reports pain in his back, head, and generalized. Cough present. Dry and congested. Afebrile.

## 2017-10-20 ENCOUNTER — Emergency Department (HOSPITAL_COMMUNITY)
Admission: EM | Admit: 2017-10-20 | Discharge: 2017-10-20 | Disposition: A | Discharge status: Home / Self Care | Payer: BLUE CROSS/BLUE SHIELD | Attending: Emergency Medicine | Admitting: Emergency Medicine

## 2017-10-20 ENCOUNTER — Other Ambulatory Visit: Payer: Self-pay

## 2017-10-20 ENCOUNTER — Emergency Department (HOSPITAL_COMMUNITY): Payer: BLUE CROSS/BLUE SHIELD

## 2017-10-20 ENCOUNTER — Encounter (HOSPITAL_COMMUNITY): Payer: Self-pay | Admitting: Emergency Medicine

## 2017-10-20 DIAGNOSIS — R3129 Other microscopic hematuria: Secondary | ICD-10-CM | POA: Diagnosis not present

## 2017-10-20 DIAGNOSIS — F17210 Nicotine dependence, cigarettes, uncomplicated: Secondary | ICD-10-CM | POA: Diagnosis not present

## 2017-10-20 DIAGNOSIS — N201 Calculus of ureter: Secondary | ICD-10-CM | POA: Insufficient documentation

## 2017-10-20 DIAGNOSIS — R1031 Right lower quadrant pain: Secondary | ICD-10-CM | POA: Diagnosis present

## 2017-10-20 LAB — COMPREHENSIVE METABOLIC PANEL
ALBUMIN: 3.9 g/dL (ref 3.5–5.0)
ALK PHOS: 47 U/L (ref 38–126)
ALT: 22 U/L (ref 17–63)
ANION GAP: 11 (ref 5–15)
AST: 33 U/L (ref 15–41)
BILIRUBIN TOTAL: 0.7 mg/dL (ref 0.3–1.2)
BUN: 7 mg/dL (ref 6–20)
CALCIUM: 9 mg/dL (ref 8.9–10.3)
CO2: 24 mmol/L (ref 22–32)
CREATININE: 0.7 mg/dL (ref 0.61–1.24)
Chloride: 103 mmol/L (ref 101–111)
GFR calc Af Amer: >60 mL/min (ref >60)
GFR calc non Af Amer: >60 mL/min (ref >60)
GLUCOSE: 96 mg/dL (ref 65–99)
Potassium: 3.7 mmol/L (ref 3.5–5.1)
Sodium: 138 mmol/L (ref 135–145)
TOTAL PROTEIN: 7.1 g/dL (ref 6.5–8.1)

## 2017-10-20 LAB — URINALYSIS, ROUTINE W REFLEX MICROSCOPIC
BILIRUBIN URINE: NEGATIVE
Bacteria, UA: NONE SEEN
GLUCOSE, UA: NEGATIVE mg/dL
KETONES UR: NEGATIVE mg/dL
LEUKOCYTES UA: NEGATIVE
NITRITE: NEGATIVE
PH: 8 (ref 5.0–8.0)
Protein, ur: 30 mg/dL — AB
SPECIFIC GRAVITY, URINE: 1.014 (ref 1.005–1.030)
Squamous Epithelial / LPF: NONE SEEN

## 2017-10-20 LAB — LIPASE, BLOOD: Lipase: 23 U/L (ref 11–51)

## 2017-10-20 LAB — CBC
HCT: 39.6 % (ref 39.0–52.0)
Hemoglobin: 13.2 g/dL (ref 13.0–17.0)
MCH: 32 pg (ref 26.0–34.0)
MCHC: 33.3 g/dL (ref 30.0–36.0)
MCV: 96.1 fL (ref 78.0–100.0)
PLATELETS: 329 10*3/uL (ref 150–400)
RBC: 4.12 MIL/uL — ABNORMAL LOW (ref 4.22–5.81)
RDW: 14 % (ref 11.5–15.5)
WBC: 5.4 10*3/uL (ref 4.0–10.5)

## 2017-10-20 MED ORDER — KETOROLAC TROMETHAMINE 30 MG/ML IJ SOLN
30.0000 mg | Freq: Once | INTRAMUSCULAR | Status: AC
  Administered 2017-10-20: 30 mg via INTRAVENOUS
  Filled 2017-10-20: qty 1

## 2017-10-20 NOTE — ED Provider Notes (Signed)
[facility] Provider [facility]   CSN: [id number] Arrival date & time: 10/20/17  [id number]     History   Chief Complaint Chief Complaint  Patient presents with  . Abdominal Pain    HPI Stephen Nielsen is a 46 y.o. male.  Patient with no significant past medical history who presents with abdominal pain. He states he has had RLQ abdominal pain since 5am this morning. The pain is a constant 10/10 dull pain that radiates to his right flank with episodes of 10/10 sharp pain in his RLQ that last 1-2 minutes and can be triggered by movement. When he woke up with the pain, he initially experienced and episode of nausea, vomiting, and liquid diarrhea. He denies other aggravating or alleviating symptoms and has not tried anything for the pain. He denies fever, chills, chest pain, or SOB.       History reviewed. No pertinent past medical history.  There are no active problems to display for this patient.   History reviewed. No pertinent surgical history.     Home Medications    Prior to Admission medications   Medication Sig Start Date End Date Taking? Authorizing Provider  benzonatate (TESSALON) 100 MG capsule Take 1 capsule (100 mg total) by mouth 3 (three) times daily as needed for cough. Patient not taking: Reported on 10/20/2017 10/05/17   Ward, Chase PicketJaime Pilcher, PA-C  diphenhydrAMINE (BENADRYL) 25 MG tablet Take 1 tablet (25 mg total) by mouth every 6 (six) hours. Patient not taking: Reported on 04/23/2016 02/03/16   Mancel BaleWentz, Elliott, MD  famotidine (PEPCID) 20 MG tablet Take 1 tablet (20 mg total) by mouth 2 (two) times daily. Patient not taking: Reported on 04/23/2016 02/03/16   Mancel BaleWentz, Elliott, MD  hydrocortisone cream 1 % Apply to affected area 2 times daily Patient not taking: Reported on 10/20/2017 12/24/16   Cheri Fowlerose, Kayla, PA-C  predniSONE (DELTASONE) 20 MG tablet Take 1 tablet (20 mg total) by mouth 2 (two) times daily. Patient not taking: Reported on  04/23/2016 02/03/16   Mancel BaleWentz, Elliott, MD    Family History No family history on file.  Social History Social History   Tobacco Use  . Smoking status: Current Every Day Smoker    Packs/day: 0.50    Types: Cigarettes  . Smokeless tobacco: Never Used  Substance Use Topics  . Alcohol use: Yes    Comment: occassional  . Drug use: No     Allergies   Patient has no known allergies.   Review of Systems Review of Systems  Constitutional: Negative for chills and fever.  HENT: Negative for hearing loss and voice change.   Eyes: Negative for pain and visual disturbance.  Respiratory: Negative for apnea, chest tightness and shortness of breath.   Cardiovascular: Negative for chest pain and leg swelling.  Gastrointestinal: Positive for diarrhea, nausea and vomiting.  Genitourinary: Positive for flank pain. Negative for difficulty urinating and dysuria.  Musculoskeletal: Negative for arthralgias and myalgias.  Neurological: Negative for numbness and headaches.     Physical Exam Updated Vital Signs BP (!) 149/90   Pulse 71   Temp 99.4 F (37.4 C) (Oral)   Resp 16   Ht 5\' 9"  (1.753 m)   Wt 72.6 kg (160 lb)   SpO2 99%   BMI 23.63 kg/m   Physical Exam  Constitutional: He is oriented to person, place, and time. He appears well-developed and well-nourished.  HENT:  Head: Normocephalic and atraumatic.  Eyes: EOM are normal. Right  eye exhibits no discharge. Left eye exhibits no discharge.  Cardiovascular: Normal rate, regular rhythm and normal heart sounds.  Pulmonary/Chest: Effort normal and breath sounds normal. No respiratory distress.  Abdominal: Bowel sounds are normal. He exhibits no distension. There is tenderness. There is guarding.  Tender to palpation of RLQ, RUQ, and Supra pubic region  Musculoskeletal: He exhibits no edema or deformity.  Neurological: He is alert and oriented to person, place, and time.  Skin: Skin is warm and dry.  Psychiatric: He has a normal mood  and affect.     ED Treatments / Results  Labs (all labs ordered are listed, but only abnormal results are displayed) Labs Reviewed  CBC - Abnormal; Notable for the following components:      Result Value   RBC 4.12 (*)    All other components within normal limits  URINALYSIS, ROUTINE W REFLEX MICROSCOPIC - Abnormal; Notable for the following components:   Hgb urine dipstick MODERATE (*)    Protein, ur 30 (*)    All other components within normal limits  LIPASE, BLOOD  COMPREHENSIVE METABOLIC PANEL    EKG  EKG Interpretation None       Radiology Ct Renal Stone Study  Result Date: 10/20/2017 CLINICAL DATA:  Acute right flank pain, microscopic hematuria. EXAM: CT ABDOMEN AND PELVIS WITHOUT CONTRAST TECHNIQUE: Multidetector CT imaging of the abdomen and pelvis was performed following the standard protocol without IV contrast. COMPARISON:  None. FINDINGS: Lower chest: No acute abnormality. Hepatobiliary: Probable fatty infiltration of the liver. No gallstones are noted. Pancreas: Unremarkable. No pancreatic ductal dilatation or surrounding inflammatory changes. Spleen: Normal in size without focal abnormality. Adrenals/Urinary Tract: Adrenal glands are unremarkable. Kidneys are normal, without renal calculi, focal lesion, or hydronephrosis. Bladder is unremarkable. Stomach/Bowel: Stomach is within normal limits. Appendix appears normal. No evidence of bowel wall thickening, distention, or inflammatory changes. Vascular/Lymphatic: Aortic atherosclerosis. No enlarged abdominal or pelvic lymph nodes. Reproductive: Prostate is unremarkable. Other: No abdominal wall hernia or abnormality. No abdominopelvic ascites. Musculoskeletal: No acute or significant osseous findings. IMPRESSION: No hydronephrosis or renal obstruction is noted. No renal or ureteral calculi are noted. Aortic atherosclerosis. Possible fatty infiltration of the liver. Electronically Signed   By: Lupita Raider, M.D.   On:  10/20/2017 12:43    Procedures Procedures (including critical care time)  Medications Ordered in ED Medications  ketorolac (TORADOL) 30 MG/ML injection 30 mg (not administered)     Initial Impression / Assessment and Plan / ED Course  I have reviewed the triage vital signs and the nursing notes.  Pertinent labs & imaging results that were available during my care of the patient were reviewed by me and considered in my medical decision making (see chart for details).    Patient with RLQ pain starting this morning, which is constant dull 10/10 (radiating to flank and goin) with 1-2 min waves of 10/10 sharp pain. Suspicious for Renal stone vs appendicitis with renal stone more likely based on history and rapid onset this morning and hemoglobin present on urinalysis. Lipase, CMP, CBC were WNL.  - CT renal stone study: negative for stone or other cause of patient abdominal pain.  Patient's abdominal pain is without definitive etiology. Suspect urolithiasis with passage of stone prior to CT.  Patient's pain has improved while in the ED and he was given Toradol for further pain control. Patient is to establish with and follow up with a PCP for repeat U/A in 2 weeks to evaluate for  resolution of hematuria.  Final Clinical Impressions(s) / ED Diagnoses   Final diagnoses:  Other microscopic hematuria  Calculus of ureter    ED Discharge Orders    None       Beola CordMelvin, Alexander, MD 10/20/17 1354    Clarene DukeLittle, Ambrose Finlandachel Morgan, MD 10/24/17 [phone]

## 2017-10-20 NOTE — ED Triage Notes (Signed)
Pt states he woke up with 10/10 lower right abd pain that is coming and going and is sharp and cramps. Pt also reports diarrhea no vomiting.

## 2017-10-20 NOTE — Discharge Instructions (Addendum)
Thank you for allowing us to care for you  Your symptoms are believed to have been caused by passing a kidney stone. No stone was seen on your imaging, but there was no other explanation seen for your symptoms either.  - We have given you a dose of Toradol for pain control.  Please follow up / establish with a primary care provider. - Have a repeat Urinalysis performed in 2 weeks to make sure there is no longer blood in your urine.  Return to the ED if you experience a return of severe symptoms.

## 2017-10-22 ENCOUNTER — Encounter: Payer: Self-pay | Admitting: Internal Medicine

## 2017-11-11 ENCOUNTER — Encounter (HOSPITAL_COMMUNITY): Payer: Self-pay | Admitting: *Deleted

## 2017-11-11 ENCOUNTER — Emergency Department (HOSPITAL_COMMUNITY)
Admission: EM | Admit: 2017-11-11 | Discharge: 2017-11-11 | Disposition: A | Discharge status: Home / Self Care | Payer: BLUE CROSS/BLUE SHIELD | Attending: Emergency Medicine | Admitting: Emergency Medicine

## 2017-11-11 ENCOUNTER — Other Ambulatory Visit: Payer: Self-pay

## 2017-11-11 DIAGNOSIS — F17210 Nicotine dependence, cigarettes, uncomplicated: Secondary | ICD-10-CM | POA: Insufficient documentation

## 2017-11-11 DIAGNOSIS — M5441 Lumbago with sciatica, right side: Secondary | ICD-10-CM

## 2017-11-11 DIAGNOSIS — M545 Low back pain: Secondary | ICD-10-CM | POA: Diagnosis present

## 2017-11-11 MED ORDER — KETOROLAC TROMETHAMINE 30 MG/ML IJ SOLN
30.0000 mg | Freq: Once | INTRAMUSCULAR | Status: AC
  Administered 2017-11-11: 30 mg via INTRAMUSCULAR
  Filled 2017-11-11: qty 1

## 2017-11-11 MED ORDER — CYCLOBENZAPRINE HCL 10 MG PO TABS: 10.0000 mg | ORAL_TABLET | Freq: Every evening | ORAL | 0 refills | Status: DC | PRN

## 2017-11-11 MED ORDER — MELOXICAM 7.5 MG PO TABS: 7.5000 mg | ORAL_TABLET | Freq: Every day | ORAL | 0 refills | Status: DC

## 2017-11-11 NOTE — ED Triage Notes (Signed)
Pt c/o mid lower to R lower back pain onset x 4 days, denies injury, denies urinary symptoms, pt c/o painful to ambulate, A&O x4, denies bowel & bladder incontinence

## 2017-11-11 NOTE — ED Provider Notes (Signed)
[facility]   CSN: [id number] Arrival date & time: 11/11/17  1053     History   Chief Complaint Chief Complaint  Patient presents with  . Back Pain    HPI Stephen Nielsen is a 46 y.o. male who presents today for evaluation of right lower back pain for approximately 4 days.  Any injury.  He denies any urinary symptoms, no dysuria, hematuria.  He was seen and evaluated on 11/6 for right flank pain and was noted to have hematuria.  No kidney stone on CT scan then.  He reports that that pain was different is that was intermittent worse this is constant.  His pain is worsened with movement, palpation and has been gradually worsening.  He denies any known injury.  He is able to ambulate, however reports that it is painful.  No changes to bowel or bladder function.  No personal history of cancer or IV drug use.  No numbness or tingling in either leg.  He does report that he feels like his pain shoots down the back of his right leg wraps around and stops at his anterior knee.  He reports that he has attempted ibuprofen and Tylenol for his pain without relief.  HPI  History reviewed. No pertinent past medical history.  There are no active problems to display for this patient.   History reviewed. No pertinent surgical history.     Home Medications    Prior to Admission medications   Medication Sig Start Date End Date Taking? Authorizing Provider  benzonatate (TESSALON) 100 MG capsule Take 1 capsule (100 mg total) by mouth 3 (three) times daily as needed for cough. Patient not taking: Reported on 10/20/2017 10/05/17   Ward, Chase PicketJaime Pilcher, PA-C  cyclobenzaprine (FLEXERIL) 10 MG tablet Take 1 tablet (10 mg total) by mouth at bedtime as needed for muscle spasms. 11/11/17   Cristina GongHammond, Hattie Pine W, PA-C  diphenhydrAMINE (BENADRYL) 25 MG tablet Take 1 tablet (25 mg total) by mouth every 6 (six) hours. Patient not taking: Reported on 04/23/2016  02/03/16   Mancel BaleWentz, Elliott, MD  famotidine (PEPCID) 20 MG tablet Take 1 tablet (20 mg total) by mouth 2 (two) times daily. Patient not taking: Reported on 04/23/2016 02/03/16   Mancel BaleWentz, Elliott, MD  hydrocortisone cream 1 % Apply to affected area 2 times daily Patient not taking: Reported on 10/20/2017 12/24/16   Cheri Fowlerose, Kayla, PA-C  meloxicam (MOBIC) 7.5 MG tablet Take 1 tablet (7.5 mg total) by mouth daily. 11/11/17   Cristina GongHammond, Shekita Boyden W, PA-C  predniSONE (DELTASONE) 20 MG tablet Take 1 tablet (20 mg total) by mouth 2 (two) times daily. Patient not taking: Reported on 04/23/2016 02/03/16   Mancel BaleWentz, Elliott, MD    Family History No family history on file.  Social History Social History   Tobacco Use  . Smoking status: Current Every Day Smoker    Packs/day: 0.50    Types: Cigarettes  . Smokeless tobacco: Never Used  Substance Use Topics  . Alcohol use: Yes    Comment: occassional  . Drug use: No     Allergies   Patient has no known allergies.   Review of Systems Review of Systems  Constitutional: Negative for chills, diaphoresis, fever and unexpected weight change.  Gastrointestinal: Negative for abdominal pain, nausea and vomiting.  Genitourinary: Negative for dysuria, hematuria and urgency.  Musculoskeletal: Positive for back pain. Negative for neck pain and neck stiffness.  Neurological: Negative for headaches.  Physical Exam Updated Vital Signs BP (!) 160/93   Pulse 79   Temp 98.6 F (37 C) (Oral)   Resp 16   Ht 5\' 9"  (1.753 m)   Wt 72.6 kg (160 lb)   SpO2 100%   BMI 23.63 kg/m   Physical Exam  Constitutional: He is oriented to person, place, and time. He appears well-developed and well-nourished.  HENT:  Head: Normocephalic and atraumatic.  Cardiovascular: Normal rate.  2+ radial, DP, PT pulses bilaterally.  Abdominal: Soft. Bowel sounds are normal. He exhibits no distension. There is no tenderness.  No CVA tenderness to percussion bilaterally.    Musculoskeletal:  5/5 strength bilaterally through flexion/extension of hips, knees, and ankles. Patient has tenderness to palpation over right-sided lumbar paraspinal muscles.  Palpation here both re-creates and exacerbates his pain.  He does not have any midline tenderness, no step-offs, or deformities.  Palpation over right-sided sciatic notch exacerbates his pain in his right leg.  Neurological: He is alert and oriented to person, place, and time.  Sensation intact to bilateral lower extremities.  Skin: Skin is warm and dry. He is not diaphoretic.  Psychiatric: He has a normal mood and affect. His behavior is normal.  Nursing note and vitals reviewed.    ED Treatments / Results  Labs (all labs ordered are listed, but only abnormal results are displayed) Labs Reviewed - No data to display  EKG  EKG Interpretation None       Radiology No results found.  Procedures Procedures (including critical care time)  Medications Ordered in ED Medications  ketorolac (TORADOL) 30 MG/ML injection 30 mg (30 mg Intramuscular Given 11/11/17 1325)     Initial Impression / Assessment and Plan / ED Course  I have reviewed the triage vital signs and the nursing notes.  Pertinent labs & imaging results that were available during my care of the patient were reviewed by me and considered in my medical decision making (see chart for details).    Patient with back pain.  No neurological deficits and normal neuro exam.  Patient can walk but states is painful.  No loss of bowel or bladder control.  No concern for cauda equina.  No fever, night sweats, weight loss, h/o cancer, IVDU.  RICE protocol and pain medicine indicated and discussed with patient. Will give Toradol here in ED, rx for mobic, flexeril.  Patient given return precautions and states his understanding.    Final Clinical Impressions(s) / ED Diagnoses   Final diagnoses:  Acute right-sided low back pain with right-sided sciatica     ED Discharge Orders        Ordered    cyclobenzaprine (FLEXERIL) 10 MG tablet  At bedtime PRN     11/11/17 1325    meloxicam (MOBIC) 7.5 MG tablet  Daily     11/11/17 1325       Cristina GongHammond, Jamesrobert Ohanesian W, [place] JerseyPA-C 11/11/17 1819    Cathren LaineSteinl, Kevin, MD 11/13/17 [phone]

## 2017-11-11 NOTE — Discharge Instructions (Signed)
Please take Ibuprofen (Advil, motrin) and Tylenol (acetaminophen) to relieve your pain.  You may take up to 600 MG (3 pills) of normal strength ibuprofen every 8 hours as needed.  In between doses of ibuprofen you make take tylenol, up to 1,000 mg (two extra strength pills).  Do not take more than 3,000 mg tylenol in a 24 hour period.  Please check all medication labels as many medications such as pain and cold medications may contain tylenol.  Do not drink alcohol while taking these medications.  Do not take other NSAID'S while taking ibuprofen (such as aleve or naproxen).  Please take ibuprofen with food to decrease stomach upset.  You are being prescribed a medication which may make you sleepy. For 24 hours after one dose please do not drive, operate heavy machinery, care for a small child with out another adult present, or perform any activities that may cause harm to you or someone else if you were to fall asleep or be impaired.   The best way to get rid of muscle pain is by taking NSAIDS, using heat, massage therapy, and gentle stretching/range of motion exercises.  Mobic is considered a NSAID.  Please wait at least 8 hours after your shot of Toradol before you take any other NSAIDs.  Please do not take other NSAIDs while taking Mobic.  Please monitor your stools.  Some people may have stomach upset from taking Mobic.  If you develop dark, tarry, sticky or otherwise bloody stools then discontinue this medicine and seek additional medical care.

## 2018-10-25 IMAGING — CT CT RENAL STONE PROTOCOL
2 of 4 series · 17 of 46 positions shown, 19 images · non-contrast
Comparison: None.

CLINICAL DATA: Acute right flank pain, microscopic hematuria.

EXAM:
CT ABDOMEN AND PELVIS WITHOUT CONTRAST
TECHNIQUE: Multidetector CT imaging of the abdomen and pelvis was performed
following the standard protocol without IV contrast.

[Series 4: stone study 5.0 i30f 2 · axial · 0.68mm/px · z∈[+794,+1198]mm · 14 of 89 slices shown, 16 images]
[im 4/89  soft-tissue]
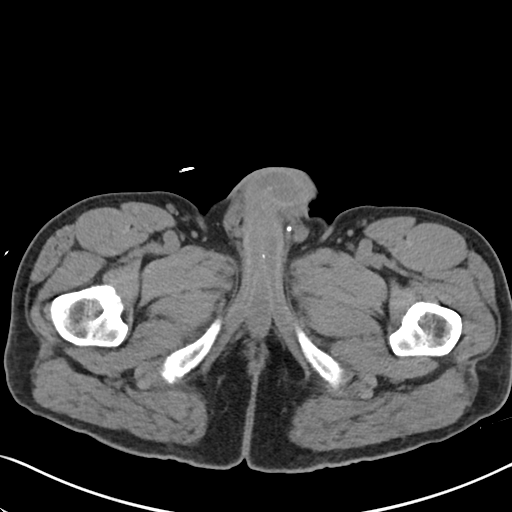
[im 4/89  bone]
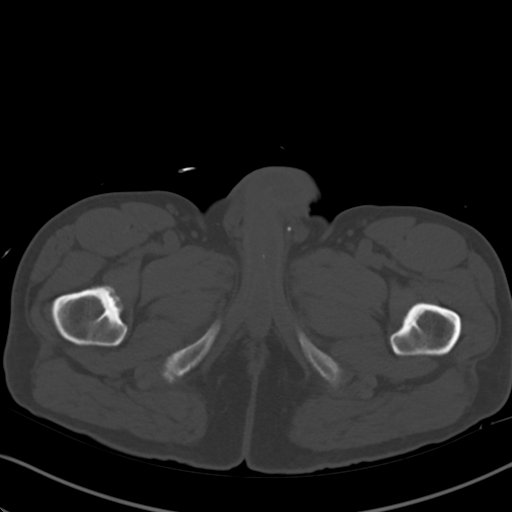
[im 12/89  soft-tissue]
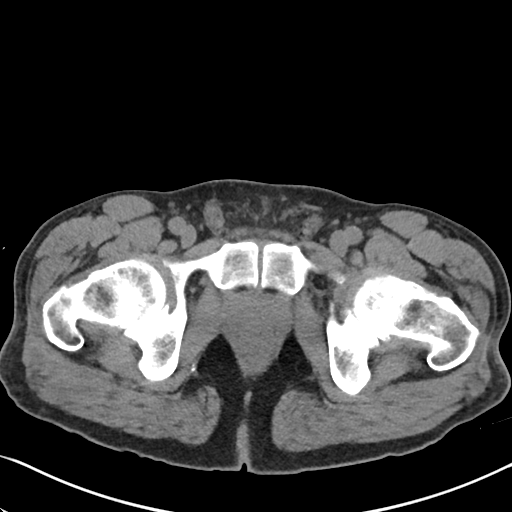
[im 16/89  soft-tissue]
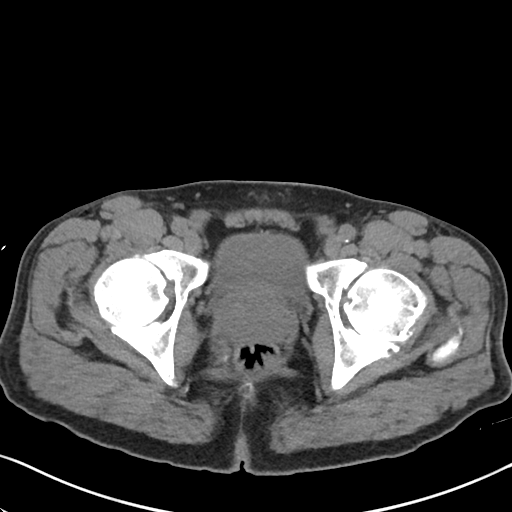
[im 23/89  soft-tissue]
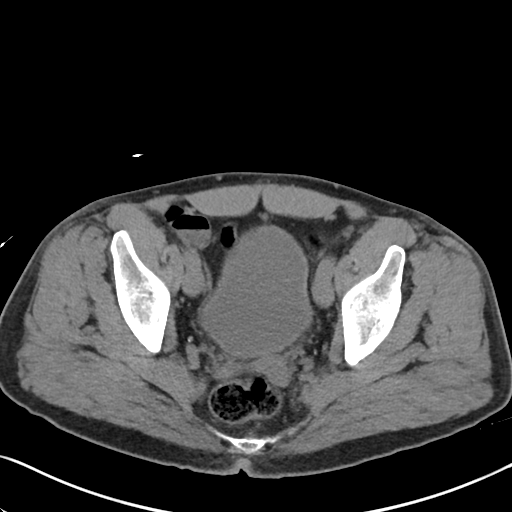
[im 31/89  soft-tissue]
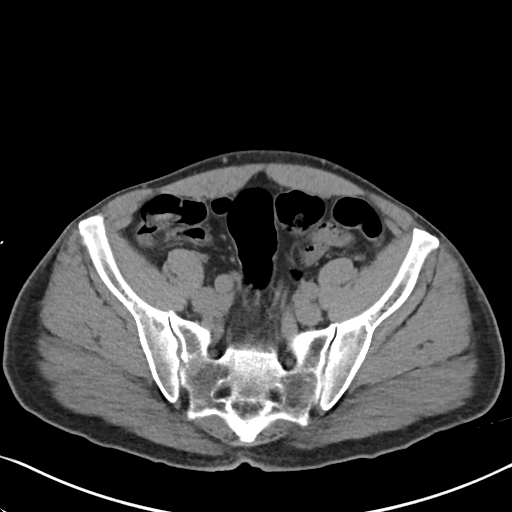
[im 35/89  soft-tissue]
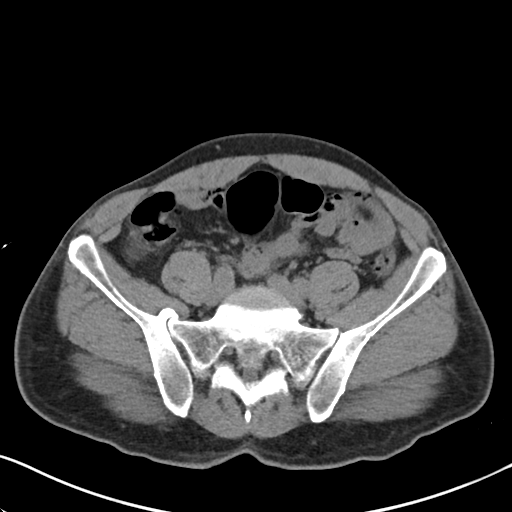
[im 43/89  soft-tissue]
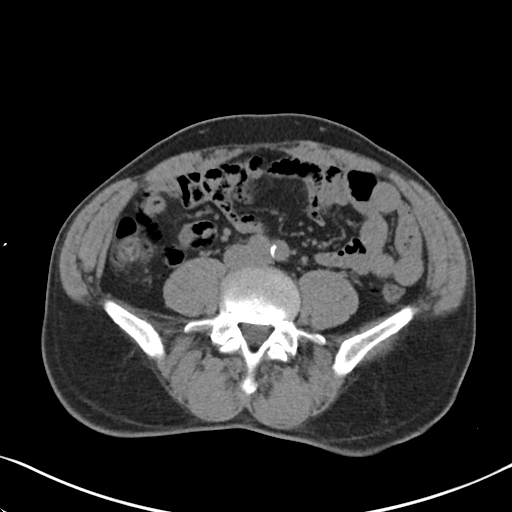
[im 46/89  soft-tissue]
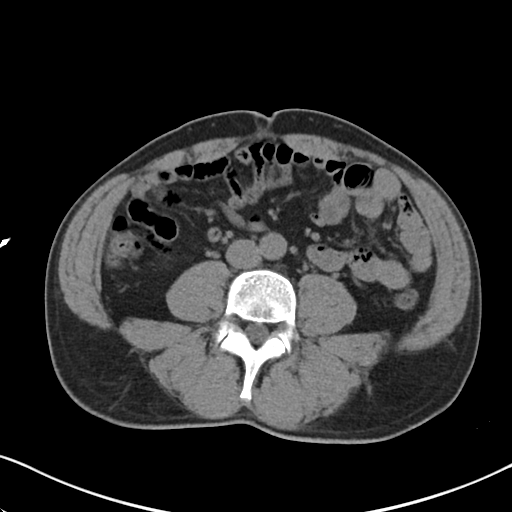
[im 54/89  soft-tissue]
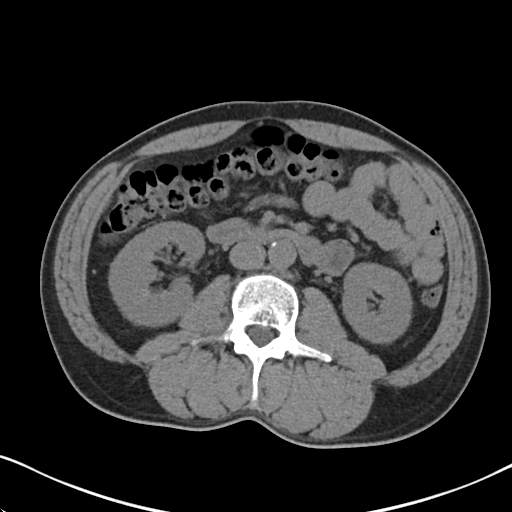
[im 54/89  bone]
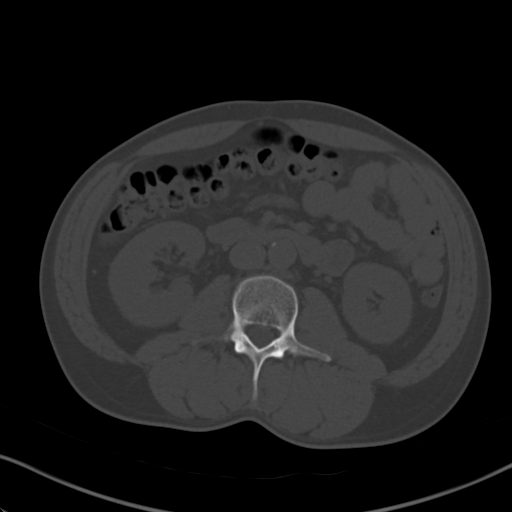
[im 58/89  soft-tissue]
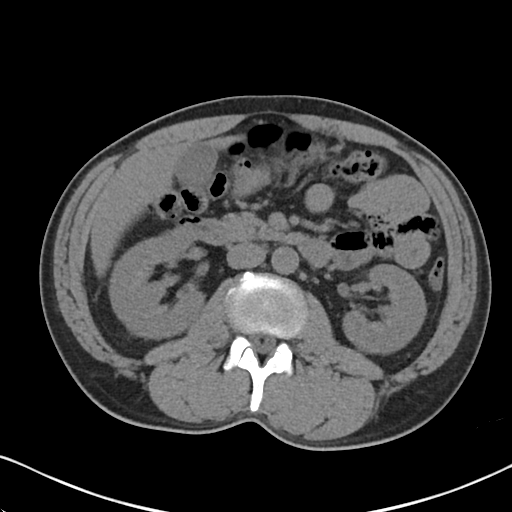
[im 66/89  soft-tissue]
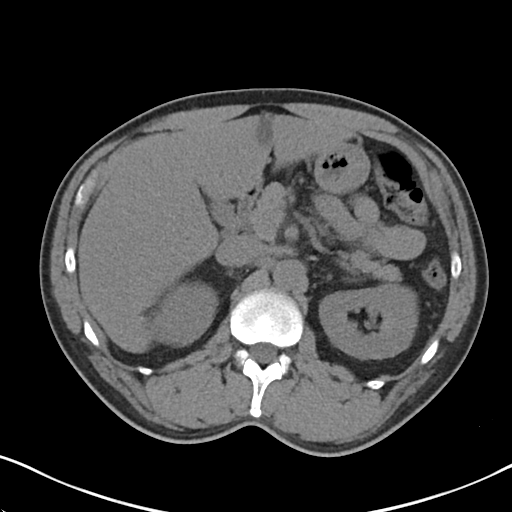
[im 73/89  soft-tissue]
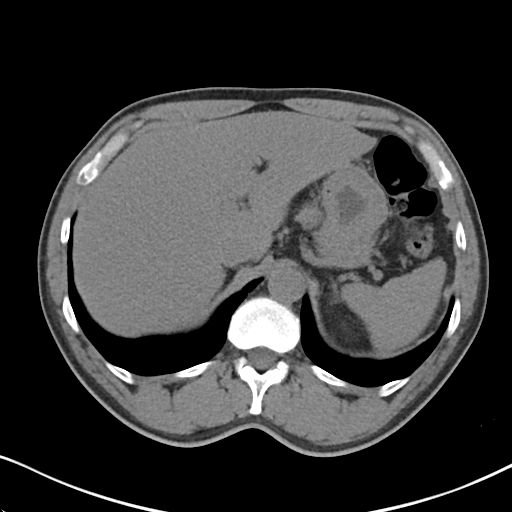
[im 77/89  soft-tissue]
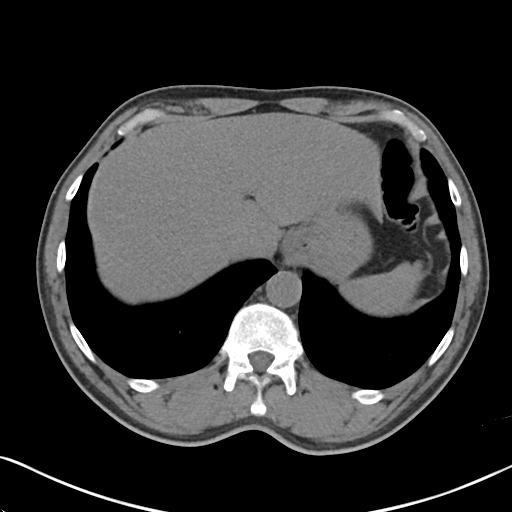
[im 85/89  soft-tissue]
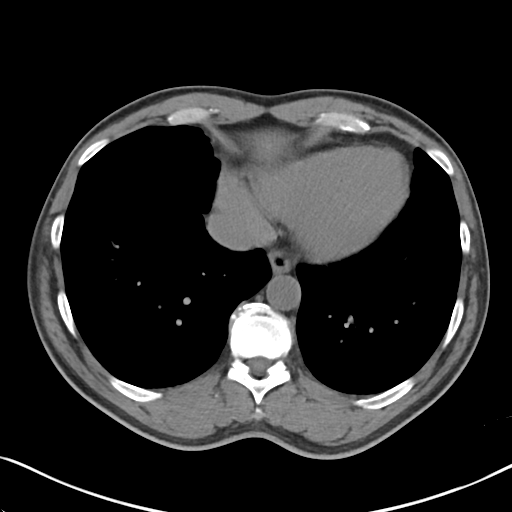

[Series 7: coronal soft tissue · coronal · 0.73mm/px · 3 of 96 slices shown]
[im 32/96  soft-tissue]
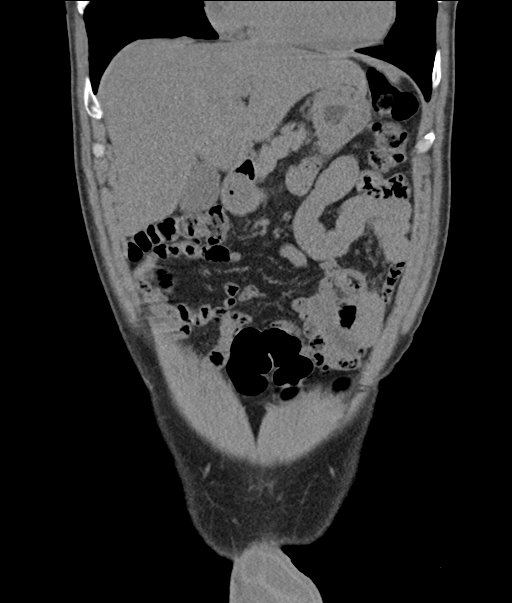
[im 43/96  soft-tissue]
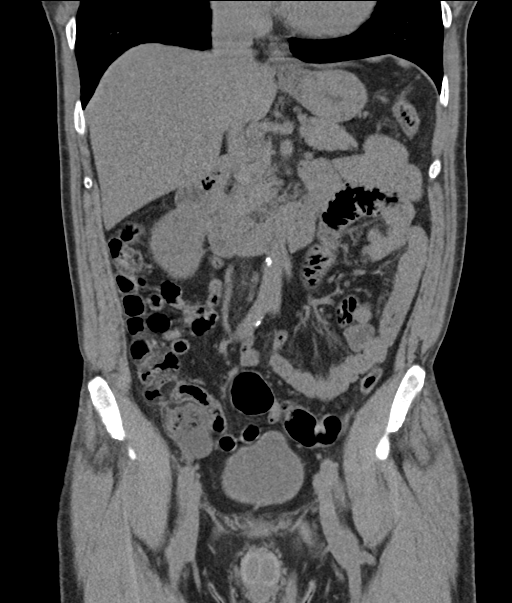
[im 53/96  soft-tissue]
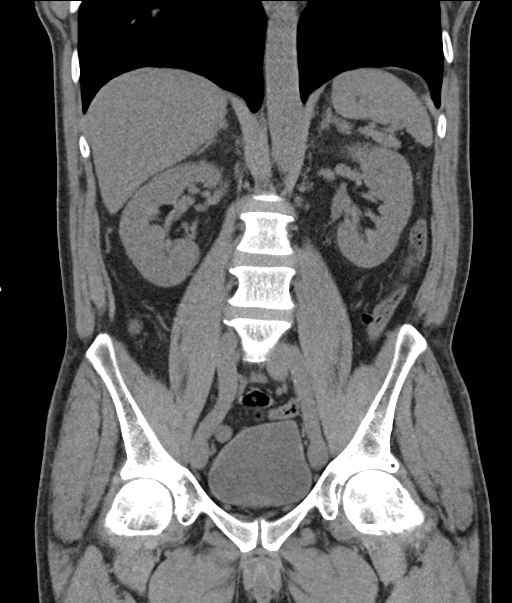

[17 of 46 positions shown; findings below may reference images not displayed]

FINDINGS: Lower chest: No acute abnormality.

Hepatobiliary: Probable fatty infiltration of the liver. No
gallstones are noted.

Pancreas: Unremarkable. No pancreatic ductal dilatation or
surrounding inflammatory changes.

Spleen: Normal in size without focal abnormality.

Adrenals/Urinary Tract: Adrenal glands are unremarkable. Kidneys are
normal, without renal calculi, focal lesion, or hydronephrosis.
Bladder is unremarkable.

Stomach/Bowel: Stomach is within normal limits. Appendix appears
normal. No evidence of bowel wall thickening, distention, or
inflammatory changes.

Vascular/Lymphatic: Aortic atherosclerosis. No enlarged abdominal or
pelvic lymph nodes.

Reproductive: Prostate is unremarkable.

Other: No abdominal wall hernia or abnormality. No abdominopelvic
ascites.

Musculoskeletal: No acute or significant osseous findings.
IMPRESSION: No hydronephrosis or renal obstruction is noted. No renal or
ureteral calculi are noted. Aortic atherosclerosis. Possible fatty
infiltration of the liver.

## 2019-01-01 ENCOUNTER — Encounter (HOSPITAL_COMMUNITY): Payer: Self-pay | Admitting: Emergency Medicine

## 2019-01-01 ENCOUNTER — Emergency Department (HOSPITAL_COMMUNITY)
Admission: EM | Admit: 2019-01-01 | Discharge: 2019-01-01 | Disposition: A | Discharge status: Home / Self Care | Payer: Worker's Compensation | Attending: Emergency Medicine | Admitting: Emergency Medicine

## 2019-01-01 DIAGNOSIS — F17210 Nicotine dependence, cigarettes, uncomplicated: Secondary | ICD-10-CM | POA: Diagnosis not present

## 2019-01-01 DIAGNOSIS — Y990 Civilian activity done for income or pay: Secondary | ICD-10-CM | POA: Diagnosis not present

## 2019-01-01 DIAGNOSIS — X19XXXA Contact with other heat and hot substances, initial encounter: Secondary | ICD-10-CM | POA: Insufficient documentation

## 2019-01-01 DIAGNOSIS — T2105XA Burn of unspecified degree of buttock, initial encounter: Secondary | ICD-10-CM | POA: Diagnosis present

## 2019-01-01 DIAGNOSIS — Y9389 Activity, other specified: Secondary | ICD-10-CM | POA: Insufficient documentation

## 2019-01-01 DIAGNOSIS — Z79899 Other long term (current) drug therapy: Secondary | ICD-10-CM | POA: Insufficient documentation

## 2019-01-01 DIAGNOSIS — Y9289 Other specified places as the place of occurrence of the external cause: Secondary | ICD-10-CM | POA: Diagnosis not present

## 2019-01-01 DIAGNOSIS — T304 Corrosion of unspecified body region, unspecified degree: Secondary | ICD-10-CM

## 2019-01-01 MED ORDER — SILVER SULFADIAZINE 1 % EX CREA
TOPICAL_CREAM | Freq: Once | CUTANEOUS | Status: AC
  Administered 2019-01-01: 11:00:00 via TOPICAL
  Filled 2019-01-01: qty 85

## 2019-01-01 NOTE — Discharge Instructions (Addendum)
Tylenol and Motrin for pain.  Follow-up with whomever your employer uses for Workmen's Comp. follow-up.  Return here as needed.  Keep the area clean and dry.
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
## 2019-01-01 NOTE — ED Notes (Signed)
Phlebotomy at bedside to perform worker's comp drug screen

## 2019-01-01 NOTE — ED Notes (Signed)
Pt verbalized understanding of dc instructions, vss, ambulatory with nad 

## 2019-01-07 ENCOUNTER — Emergency Department (HOSPITAL_COMMUNITY)
Admission: EM | Admit: 2019-01-07 | Discharge: 2019-01-07 | Disposition: A | Discharge status: Home / Self Care | Payer: Worker's Compensation | Attending: Emergency Medicine | Admitting: Emergency Medicine

## 2019-01-07 ENCOUNTER — Other Ambulatory Visit: Payer: Self-pay

## 2019-01-07 DIAGNOSIS — Z79899 Other long term (current) drug therapy: Secondary | ICD-10-CM | POA: Insufficient documentation

## 2019-01-07 DIAGNOSIS — Y998 Other external cause status: Secondary | ICD-10-CM | POA: Insufficient documentation

## 2019-01-07 DIAGNOSIS — Y939 Activity, unspecified: Secondary | ICD-10-CM | POA: Diagnosis not present

## 2019-01-07 DIAGNOSIS — T2115XA Burn of first degree of buttock, initial encounter: Secondary | ICD-10-CM | POA: Diagnosis not present

## 2019-01-07 DIAGNOSIS — X19XXXA Contact with other heat and hot substances, initial encounter: Secondary | ICD-10-CM | POA: Diagnosis not present

## 2019-01-07 DIAGNOSIS — Y929 Unspecified place or not applicable: Secondary | ICD-10-CM | POA: Insufficient documentation

## 2019-01-07 DIAGNOSIS — F17210 Nicotine dependence, cigarettes, uncomplicated: Secondary | ICD-10-CM | POA: Insufficient documentation

## 2019-01-07 DIAGNOSIS — T2125XA Burn of second degree of buttock, initial encounter: Secondary | ICD-10-CM

## 2019-01-07 MED ORDER — SILVER SULFADIAZINE 1 % EX CREA
TOPICAL_CREAM | Freq: Once | CUTANEOUS | Status: AC
  Administered 2019-01-07: 1 via TOPICAL
  Filled 2019-01-07: qty 85

## 2019-01-07 NOTE — Discharge Instructions (Addendum)
Continue silvadene.  Recheck at Urgent care in 3 days

## 2019-01-07 NOTE — ED Notes (Signed)
Patient verbalized understanding of discharge instructions and denies any further needs or questions at this time. VS stable. Patient ambulatory with steady gait.  
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
## 2019-01-10 ENCOUNTER — Emergency Department (HOSPITAL_COMMUNITY)
Admission: EM | Admit: 2019-01-10 | Discharge: 2019-01-10 | Disposition: A | Discharge status: Home / Self Care | Payer: Worker's Compensation | Attending: Emergency Medicine | Admitting: Emergency Medicine

## 2019-01-10 ENCOUNTER — Encounter (HOSPITAL_COMMUNITY): Payer: Self-pay

## 2019-01-10 DIAGNOSIS — Y33XXXD Other specified events, undetermined intent, subsequent encounter: Secondary | ICD-10-CM | POA: Diagnosis not present

## 2019-01-10 DIAGNOSIS — Y929 Unspecified place or not applicable: Secondary | ICD-10-CM | POA: Insufficient documentation

## 2019-01-10 DIAGNOSIS — T2145XD Corrosion of unspecified degree of buttock, subsequent encounter: Secondary | ICD-10-CM | POA: Insufficient documentation

## 2019-01-10 DIAGNOSIS — Z79899 Other long term (current) drug therapy: Secondary | ICD-10-CM | POA: Insufficient documentation

## 2019-01-10 DIAGNOSIS — T6594XD Toxic effect of unspecified substance, undetermined, subsequent encounter: Secondary | ICD-10-CM | POA: Diagnosis not present

## 2019-01-10 DIAGNOSIS — F17210 Nicotine dependence, cigarettes, uncomplicated: Secondary | ICD-10-CM | POA: Insufficient documentation

## 2019-01-10 DIAGNOSIS — Z5189 Encounter for other specified aftercare: Secondary | ICD-10-CM | POA: Diagnosis not present
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
## 2019-01-25 ENCOUNTER — Ambulatory Visit (INDEPENDENT_AMBULATORY_CARE_PROVIDER_SITE_OTHER): Payer: Worker's Compensation | Admitting: Plastic Surgery

## 2019-01-25 ENCOUNTER — Encounter: Payer: Self-pay | Admitting: Plastic Surgery

## 2019-01-25 DIAGNOSIS — T2135XA Burn of third degree of buttock, initial encounter: Secondary | ICD-10-CM | POA: Diagnosis not present

## 2019-01-25 MED ORDER — LIDOCAINE-PRILOCAINE 2.5-2.5 % EX CREA: 1.0000 "application " | TOPICAL_CREAM | CUTANEOUS | 0 refills | Status: DC | PRN
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
## 2019-02-08 ENCOUNTER — Encounter: Payer: Self-pay | Admitting: Plastic Surgery

## 2019-02-08 ENCOUNTER — Ambulatory Visit (INDEPENDENT_AMBULATORY_CARE_PROVIDER_SITE_OTHER): Payer: Worker's Compensation | Admitting: Plastic Surgery

## 2019-02-08 VITALS — BP 128/86 | HR 90 | Temp 98.2°F | Ht 70.0 in | Wt 165.0 lb

## 2019-02-08 DIAGNOSIS — T2135XA Burn of third degree of buttock, initial encounter: Secondary | ICD-10-CM
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
## 2019-03-01 ENCOUNTER — Other Ambulatory Visit: Payer: Self-pay

## 2019-03-01 ENCOUNTER — Ambulatory Visit (INDEPENDENT_AMBULATORY_CARE_PROVIDER_SITE_OTHER): Payer: Worker's Compensation | Admitting: Plastic Surgery

## 2019-03-01 ENCOUNTER — Encounter: Payer: Self-pay | Admitting: Plastic Surgery

## 2019-03-01 VITALS — BP 154/78 | HR 100 | Temp 98.5°F | Ht 70.0 in | Wt 158.8 lb

## 2019-03-01 DIAGNOSIS — T2135XA Burn of third degree of buttock, initial encounter: Secondary | ICD-10-CM
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
## 2019-04-20 NOTE — Progress Notes (Signed)
COVID Hotel Screening performed. Temperature, PHQ-9, and need for medical care and medications assessed. No additional needs assessed at this time.  Treshawn Allen RN MSN 

## 2019-05-04 NOTE — Progress Notes (Signed)
COVID Hotel Screening performed. Temperature, PHQ-9, and need for medical care and medications assessed. No additional needs assessed at this time.  Naresh Althaus RN MSN 

## 2019-05-19 ENCOUNTER — Other Ambulatory Visit (HOSPITAL_COMMUNITY): Payer: Self-pay

## 2019-05-19 DIAGNOSIS — Z20822 Contact with and (suspected) exposure to covid-19: Secondary | ICD-10-CM

## 2019-05-20 ENCOUNTER — Other Ambulatory Visit: Payer: Self-pay

## 2019-05-20 DIAGNOSIS — Z20822 Contact with and (suspected) exposure to covid-19: Secondary | ICD-10-CM

## 2019-05-23 LAB — NOVEL CORONAVIRUS, NAA: SARS-CoV-2, NAA: NOT DETECTED

## 2019-05-23 NOTE — Progress Notes (Signed)
COVID Hotel Screening performed. COVID screening, temperature, and need for medical care and medications assessed. Patient agreed to the COVID-19 testing. No additional needs assessed at this time.  Spencer Cardinal RN MSN 

## 2019-05-29 NOTE — Progress Notes (Signed)
COVID Hotel Screening performed. COVID screening, temperature, PHQ-9, and need for medical care and medications assessed. No additional needs assessed at this time.  Hunt Zajicek RN MSN 

## 2019-06-02 NOTE — Progress Notes (Signed)
COVID Hotel Screening performed. Temperature, PHQ-9, and need for medical care and medications assessed. No additional needs assessed at this time. 

## 2019-06-09 NOTE — Progress Notes (Signed)
COVID Hotel Screening performed. Temperature, PHQ-9, and need for medical care and medications assessed. No additional needs assessed at this time.  Stephen Waller  MSN, RN 

## 2019-06-16 NOTE — Progress Notes (Signed)
COVID Hotel Screening performed. Temperature, PHQ-9, and need for medical care and medications assessed. No additional needs assessed at this time.  Ruth Tully  MSN, RN 

## 2019-06-23 ENCOUNTER — Other Ambulatory Visit: Payer: Self-pay | Admitting: *Deleted

## 2019-06-23 DIAGNOSIS — Z20822 Contact with and (suspected) exposure to covid-19: Secondary | ICD-10-CM

## 2019-06-25 NOTE — Addendum Note (Signed)
Addended by: Roe Wilner M on: 06/25/2019 10:32 AM   Modules accepted: Orders  

## 2019-07-08 ENCOUNTER — Other Ambulatory Visit: Payer: Self-pay

## 2019-07-08 DIAGNOSIS — Z20822 Contact with and (suspected) exposure to covid-19: Secondary | ICD-10-CM

## 2019-07-11 LAB — NOVEL CORONAVIRUS, NAA: SARS-CoV-2, NAA: NOT DETECTED

## 2019-08-12 NOTE — Progress Notes (Signed)
COVID-19 Screening performed. Temperature, PHQ-9, and need for medical care and medications assessed. No additional needs assessed at this time.  Christne Platts MSN, RN 

## 2019-09-12 ENCOUNTER — Encounter (HOSPITAL_COMMUNITY): Payer: Self-pay

## 2019-09-12 ENCOUNTER — Encounter (HOSPITAL_COMMUNITY): Payer: Self-pay | Admitting: *Deleted

## 2019-09-12 ENCOUNTER — Other Ambulatory Visit: Payer: Self-pay

## 2019-09-12 ENCOUNTER — Emergency Department (HOSPITAL_COMMUNITY)
Admission: EM | Admit: 2019-09-12 | Discharge: 2019-09-12 | Discharge status: Against Medical Advice | Payer: Self-pay | Attending: Emergency Medicine | Admitting: Emergency Medicine

## 2019-09-12 ENCOUNTER — Emergency Department (HOSPITAL_COMMUNITY)
Admission: EM | Admit: 2019-09-12 | Discharge: 2019-09-13 | Disposition: A | Discharge status: Home / Self Care | Payer: Self-pay | Attending: Emergency Medicine | Admitting: Emergency Medicine

## 2019-09-12 DIAGNOSIS — F17210 Nicotine dependence, cigarettes, uncomplicated: Secondary | ICD-10-CM | POA: Insufficient documentation

## 2019-09-12 DIAGNOSIS — R7989 Other specified abnormal findings of blood chemistry: Secondary | ICD-10-CM

## 2019-09-12 DIAGNOSIS — Z5321 Procedure and treatment not carried out due to patient leaving prior to being seen by health care provider: Secondary | ICD-10-CM

## 2019-09-12 DIAGNOSIS — R109 Unspecified abdominal pain: Secondary | ICD-10-CM

## 2019-09-12 DIAGNOSIS — Z5320 Procedure and treatment not carried out because of patient's decision for unspecified reasons: Secondary | ICD-10-CM | POA: Insufficient documentation

## 2019-09-12 DIAGNOSIS — R1031 Right lower quadrant pain: Secondary | ICD-10-CM | POA: Insufficient documentation

## 2019-09-12 DIAGNOSIS — R319 Hematuria, unspecified: Secondary | ICD-10-CM | POA: Insufficient documentation

## 2019-09-12 DIAGNOSIS — R112 Nausea with vomiting, unspecified: Secondary | ICD-10-CM | POA: Insufficient documentation

## 2019-09-12 DIAGNOSIS — R945 Abnormal results of liver function studies: Secondary | ICD-10-CM | POA: Insufficient documentation

## 2019-09-12 DIAGNOSIS — R748 Abnormal levels of other serum enzymes: Secondary | ICD-10-CM | POA: Insufficient documentation

## 2019-09-12 LAB — CBC
HCT: 39.5 % (ref 39.0–52.0)
HCT: 38.8 % — ABNORMAL LOW (ref 39.0–52.0)
Hemoglobin: 13.7 g/dL (ref 13.0–17.0)
Hemoglobin: 12.7 g/dL — ABNORMAL LOW (ref 13.0–17.0)
MCH: 34.3 pg — ABNORMAL HIGH (ref 26.0–34.0)
MCH: 32.9 pg (ref 26.0–34.0)
MCHC: 34.7 g/dL (ref 30.0–36.0)
MCHC: 32.7 g/dL (ref 30.0–36.0)
MCV: 99 fL (ref 80.0–100.0)
MCV: 100.5 fL — ABNORMAL HIGH (ref 80.0–100.0)
Platelets: 206 10*3/uL (ref 150–400)
Platelets: 188 10*3/uL (ref 150–400)
RBC: 3.99 MIL/uL — ABNORMAL LOW (ref 4.22–5.81)
RBC: 3.86 MIL/uL — ABNORMAL LOW (ref 4.22–5.81)
RDW: 15.3 % (ref 11.5–15.5)
RDW: 15.2 % (ref 11.5–15.5)
WBC: 5.7 10*3/uL (ref 4.0–10.5)
WBC: 6.2 10*3/uL (ref 4.0–10.5)
nRBC: 0 % (ref 0.0–0.2)
nRBC: 0 % (ref 0.0–0.2)

## 2019-09-12 LAB — URINALYSIS, ROUTINE W REFLEX MICROSCOPIC
Bilirubin Urine: NEGATIVE
Glucose, UA: NEGATIVE mg/dL
Ketones, ur: 20 mg/dL — AB
Leukocytes,Ua: NEGATIVE
Nitrite: NEGATIVE
Protein, ur: 100 mg/dL — AB
RBC / HPF: >50 RBC/hpf — ABNORMAL HIGH (ref 0–5)
Specific Gravity, Urine: 1.02 (ref 1.005–1.030)
pH: 8 (ref 5.0–8.0)

## 2019-09-12 LAB — COMPREHENSIVE METABOLIC PANEL
ALT: 197 U/L — ABNORMAL HIGH (ref 0–44)
ALT: 168 U/L — ABNORMAL HIGH (ref 0–44)
AST: 300 U/L — ABNORMAL HIGH (ref 15–41)
AST: 181 U/L — ABNORMAL HIGH (ref 15–41)
Albumin: 4.6 g/dL (ref 3.5–5.0)
Albumin: 4.5 g/dL (ref 3.5–5.0)
Alkaline Phosphatase: 63 U/L (ref 38–126)
Alkaline Phosphatase: 69 U/L (ref 38–126)
Anion gap: 15 (ref 5–15)
Anion gap: 14 (ref 5–15)
BUN: 6 mg/dL (ref 6–20)
BUN: 9 mg/dL (ref 6–20)
CO2: 24 mmol/L (ref 22–32)
CO2: 25 mmol/L (ref 22–32)
Calcium: 9.7 mg/dL (ref 8.9–10.3)
Calcium: 10.2 mg/dL (ref 8.9–10.3)
Chloride: 100 mmol/L (ref 98–111)
Chloride: 95 mmol/L — ABNORMAL LOW (ref 98–111)
Creatinine, Ser: 0.75 mg/dL (ref 0.61–1.24)
Creatinine, Ser: 0.83 mg/dL (ref 0.61–1.24)
GFR calc Af Amer: >60 mL/min (ref >60)
GFR calc Af Amer: >60 mL/min (ref >60)
GFR calc non Af Amer: >60 mL/min (ref >60)
GFR calc non Af Amer: >60 mL/min (ref >60)
Glucose, Bld: 101 mg/dL — ABNORMAL HIGH (ref 70–99)
Glucose, Bld: 138 mg/dL — ABNORMAL HIGH (ref 70–99)
Potassium: 3.9 mmol/L (ref 3.5–5.1)
Potassium: 3.4 mmol/L — ABNORMAL LOW (ref 3.5–5.1)
Sodium: 139 mmol/L (ref 135–145)
Sodium: 134 mmol/L — ABNORMAL LOW (ref 135–145)
Total Bilirubin: 1.3 mg/dL — ABNORMAL HIGH (ref 0.3–1.2)
Total Bilirubin: 1.6 mg/dL — ABNORMAL HIGH (ref 0.3–1.2)
Total Protein: 7.7 g/dL (ref 6.5–8.1)
Total Protein: 7.4 g/dL (ref 6.5–8.1)

## 2019-09-12 LAB — LIPASE, BLOOD
Lipase: 23 U/L (ref 11–51)
Lipase: 24 U/L (ref 11–51)

## 2019-09-12 MED ORDER — SODIUM CHLORIDE 0.9% FLUSH: 3.0000 mL | Freq: Once | INTRAVENOUS | Status: DC

## 2019-09-12 NOTE — ED Notes (Signed)
Patient states he has to leave due to a family emergency, IV discontinued.  Told him if he doesn't get better to return for care.

## 2019-09-12 NOTE — ED Triage Notes (Signed)
Pt c/o R sided abd pain for the past 24 hours with emesis. Was seen this morning but had to leave prior to CT scan
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
## 2019-09-12 NOTE — ED Triage Notes (Signed)
Patient complains of right sided abd. Pain with emesis since this am. States that he has seen blood in same. Alert and oriented

## 2019-09-13 ENCOUNTER — Emergency Department (HOSPITAL_COMMUNITY): Payer: Self-pay

## 2019-09-13 MED ORDER — CHLORDIAZEPOXIDE HCL 25 MG PO CAPS: ORAL_CAPSULE | ORAL | 0 refills | Status: DC

## 2019-09-13 MED ORDER — HYDROMORPHONE HCL 1 MG/ML IJ SOLN
1.0000 mg | Freq: Once | INTRAMUSCULAR | Status: AC
  Administered 2019-09-13: 1 mg via INTRAVENOUS
  Filled 2019-09-13: qty 1

## 2019-09-13 MED ORDER — SODIUM CHLORIDE 0.9 % IV BOLUS
1000.0000 mL | Freq: Once | INTRAVENOUS | Status: AC
  Administered 2019-09-13: 1000 mL via INTRAVENOUS

## 2019-09-13 MED ORDER — IOHEXOL 300 MG/ML  SOLN
100.0000 mL | Freq: Once | INTRAMUSCULAR | Status: AC
  Administered 2019-09-13: 08:00:00 80 mL via INTRAVENOUS

## 2019-09-13 MED ORDER — ONDANSETRON HCL 4 MG/2ML IJ SOLN
4.0000 mg | Freq: Once | INTRAMUSCULAR | Status: AC
  Administered 2019-09-13: 4 mg via INTRAVENOUS
  Filled 2019-09-13: qty 2

## 2019-09-13 NOTE — ED Provider Notes (Signed)
Pt care assumed at 0700.  Pt here for evaluation of right sided abdominal pain, hx of EtOH abuse.  CTAP pending.    CT with no clear source of pain.  Pt is pain free on assessment with soft and nontender abdomen.  D/w pt elevated LFTs - recommend stopping alcohol intake and prescribed librium.  Discussed avoiding acetaminophen.  Discussed hematuria and need for outpatient Urology follow up.  Return precautions discussed.     Quintella Reichert, MD 09/13/19 1006
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
## 2019-09-13 NOTE — ED Notes (Signed)
Patient transported to CT 

## 2019-09-13 NOTE — ED Notes (Signed)
Pt just stepped outside and smoked a cigarette

## 2019-11-29 ENCOUNTER — Emergency Department (HOSPITAL_COMMUNITY)
Admission: EM | Admit: 2019-11-29 | Discharge: 2019-11-29 | Disposition: A | Discharge status: Home / Self Care | Payer: Self-pay | Attending: Emergency Medicine | Admitting: Emergency Medicine

## 2019-11-29 ENCOUNTER — Encounter (HOSPITAL_COMMUNITY): Payer: Self-pay | Admitting: Emergency Medicine

## 2019-11-29 ENCOUNTER — Other Ambulatory Visit: Payer: Self-pay

## 2019-11-29 ENCOUNTER — Emergency Department (HOSPITAL_COMMUNITY): Payer: Self-pay

## 2019-11-29 DIAGNOSIS — R2241 Localized swelling, mass and lump, right lower limb: Secondary | ICD-10-CM | POA: Insufficient documentation

## 2019-11-29 DIAGNOSIS — M7989 Other specified soft tissue disorders: Secondary | ICD-10-CM | POA: Insufficient documentation

## 2019-11-29 DIAGNOSIS — F17210 Nicotine dependence, cigarettes, uncomplicated: Secondary | ICD-10-CM | POA: Insufficient documentation

## 2019-11-29 MED ORDER — ENOXAPARIN SODIUM 80 MG/0.8ML ~~LOC~~ SOLN
75.0000 mg | Freq: Once | SUBCUTANEOUS | Status: AC
  Administered 2019-11-29: 75 mg via SUBCUTANEOUS
  Filled 2019-11-29: qty 0.75
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
## 2019-11-29 NOTE — ED Notes (Signed)
RN called pharmacy to send lovenox injection, pending discharge

## 2019-11-29 NOTE — ED Notes (Signed)
Pt verbalized understanding of discharge instructions. Follow up care, crutches, and pain management reviewed. Pt ambulated w/ crutches to lobby to wait for son to pick him up.

## 2019-11-29 NOTE — Discharge Instructions (Addendum)
  Follow the instructions on this paperwork to come back for venous ultrasound to check for blood clot.  If ultrasound is negative for blood clot, follow the instructions below:  You have been seen today for a knee injury. There were no acute abnormalities on the x-rays, including no sign of fracture or dislocation, however, there could be injuries to the soft tissues, such as the ligaments or tendons that are not seen on xrays. There could also be what are called occult fractures that are small fractures not seen on xray. Ice: May apply ice to the area over the next 24 hours for 15 minutes at a time to reduce swelling. Elevation: Keep the extremity elevated as often as possible to reduce pain and inflammation. Support: Wear the knee immobilizer for support and comfort. Wear this until pain resolves. You will be weight-bearing as tolerated, which means you can slowly start to put weight on the extremity and increase amount and frequency as pain allows. Exercises: Start by performing these exercises a few times a week, increasing the frequency until you are performing them twice daily.  Follow up: If symptoms are improving, you may follow up with your primary care provider for any continued management. If symptoms are not starting to improve within a week, you should follow up with the orthopedic specialist within two weeks. Return: Return to the ED for numbness, weakness, increasing pain, overall worsening symptoms, loss of function, or if symptoms are not improving, you have tried to follow up with the orthopedic specialist, and have been unable to do so.  For prescription assistance, may try using prescription discount sites or apps, such as goodrx.com

## 2019-11-29 NOTE — ED Triage Notes (Addendum)
Pt complains of right knee and ankle pain after jumping of 36ft loading dock on Saturday. Pt states they are swollen and painful. Did not hit head or lose consciousness.

## 2019-11-30 ENCOUNTER — Ambulatory Visit (HOSPITAL_COMMUNITY)
Admission: RE | Admit: 2019-11-30 | Discharge: 2019-11-30 | Disposition: A | Discharge status: Home / Self Care | Payer: Self-pay | Source: Ambulatory Visit | Attending: Emergency Medicine | Admitting: Emergency Medicine

## 2019-11-30 DIAGNOSIS — M79604 Pain in right leg: Secondary | ICD-10-CM | POA: Insufficient documentation

## 2019-11-30 DIAGNOSIS — M7989 Other specified soft tissue disorders: Secondary | ICD-10-CM | POA: Insufficient documentation

## 2019-11-30 DIAGNOSIS — M79609 Pain in unspecified limb: Secondary | ICD-10-CM

## 2019-11-30 NOTE — Progress Notes (Signed)
RLE venous duplex       has been completed. Preliminary results can be found under CV proc through chart review. [name], BS, RDMS, RVT   

## 2020-11-04 ENCOUNTER — Emergency Department (HOSPITAL_COMMUNITY): Payer: Self-pay

## 2020-11-04 ENCOUNTER — Other Ambulatory Visit: Payer: Self-pay

## 2020-11-04 ENCOUNTER — Encounter (HOSPITAL_COMMUNITY): Payer: Self-pay | Admitting: Emergency Medicine

## 2020-11-04 ENCOUNTER — Emergency Department (HOSPITAL_COMMUNITY)
Admission: EM | Admit: 2020-11-04 | Discharge: 2020-11-04 | Disposition: A | Discharge status: Home / Self Care | Payer: Self-pay | Attending: Emergency Medicine | Admitting: Emergency Medicine

## 2020-11-04 DIAGNOSIS — R0789 Other chest pain: Secondary | ICD-10-CM | POA: Insufficient documentation

## 2020-11-04 DIAGNOSIS — Z20822 Contact with and (suspected) exposure to covid-19: Secondary | ICD-10-CM | POA: Insufficient documentation

## 2020-11-04 DIAGNOSIS — R059 Cough, unspecified: Secondary | ICD-10-CM | POA: Insufficient documentation

## 2020-11-04 DIAGNOSIS — F17210 Nicotine dependence, cigarettes, uncomplicated: Secondary | ICD-10-CM | POA: Insufficient documentation

## 2020-11-04 LAB — BASIC METABOLIC PANEL
Anion gap: 11 (ref 5–15)
BUN: 6 mg/dL (ref 6–20)
CO2: 29 mmol/L (ref 22–32)
Calcium: 9.9 mg/dL (ref 8.9–10.3)
Chloride: 96 mmol/L — ABNORMAL LOW (ref 98–111)
Creatinine, Ser: 0.82 mg/dL (ref 0.61–1.24)
GFR, Estimated: >60 mL/min (ref >60)
Glucose, Bld: 152 mg/dL — ABNORMAL HIGH (ref 70–99)
Potassium: 3.7 mmol/L (ref 3.5–5.1)
Sodium: 136 mmol/L (ref 135–145)

## 2020-11-04 LAB — TROPONIN I (HIGH SENSITIVITY)
Troponin I (High Sensitivity): 6 ng/L (ref <18)
Troponin I (High Sensitivity): 6 ng/L (ref <18)

## 2020-11-04 LAB — CBC
HCT: 41.1 % (ref 39.0–52.0)
Hemoglobin: 13.5 g/dL (ref 13.0–17.0)
MCH: 32.9 pg (ref 26.0–34.0)
MCHC: 32.8 g/dL (ref 30.0–36.0)
MCV: 100.2 fL — ABNORMAL HIGH (ref 80.0–100.0)
Platelets: 232 10*3/uL (ref 150–400)
RBC: 4.1 MIL/uL — ABNORMAL LOW (ref 4.22–5.81)
RDW: 14 % (ref 11.5–15.5)
WBC: 4.1 10*3/uL (ref 4.0–10.5)
nRBC: 0 % (ref 0.0–0.2)

## 2020-11-04 LAB — RESPIRATORY PANEL BY RT PCR (FLU A&B, COVID)
Influenza A by PCR: NEGATIVE
Influenza B by PCR: NEGATIVE
SARS Coronavirus 2 by RT PCR: NEGATIVE

## 2020-11-04 MED ORDER — LIDOCAINE 5 % EX PTCH: 1.0000 | MEDICATED_PATCH | CUTANEOUS | 0 refills | Status: DC

## 2020-11-04 MED ORDER — BENZONATATE 100 MG PO CAPS: 100.0000 mg | ORAL_CAPSULE | Freq: Three times a day (TID) | ORAL | 0 refills | Status: DC

## 2020-11-04 MED ORDER — ASPIRIN 81 MG PO CHEW
324.0000 mg | CHEWABLE_TABLET | Freq: Once | ORAL | Status: AC
  Administered 2020-11-04: 324 mg via ORAL
  Filled 2020-11-04: qty 4

## 2020-11-04 MED ORDER — SODIUM CHLORIDE 0.9 % IV BOLUS
1000.0000 mL | Freq: Once | INTRAVENOUS | Status: AC
  Administered 2020-11-04: 1000 mL via INTRAVENOUS

## 2020-11-04 MED ORDER — CYCLOBENZAPRINE HCL 10 MG PO TABS: 10.0000 mg | ORAL_TABLET | Freq: Two times a day (BID) | ORAL | 0 refills | Status: DC | PRN

## 2020-11-04 MED ORDER — HYDROCODONE-ACETAMINOPHEN 5-325 MG PO TABS
1.0000 | ORAL_TABLET | ORAL | 0 refills | Status: DC | PRN
Start: 2020-11-04 — End: 2021-02-01

## 2020-11-04 MED ORDER — NAPROXEN 500 MG PO TABS: 500.0000 mg | ORAL_TABLET | Freq: Two times a day (BID) | ORAL | 0 refills | Status: DC

## 2020-11-04 MED ORDER — ONDANSETRON HCL 4 MG/2ML IJ SOLN
4.0000 mg | Freq: Once | INTRAMUSCULAR | Status: AC
  Administered 2020-11-04: 4 mg via INTRAVENOUS
  Filled 2020-11-04: qty 2

## 2020-11-04 MED ORDER — IOHEXOL 350 MG/ML SOLN
80.0000 mL | Freq: Once | INTRAVENOUS | Status: AC | PRN
  Administered 2020-11-04: 80 mL via INTRAVENOUS

## 2020-11-04 MED ORDER — MORPHINE SULFATE (PF) 4 MG/ML IV SOLN
4.0000 mg | Freq: Once | INTRAVENOUS | Status: DC
  Filled 2020-11-04: qty 1

## 2020-11-04 NOTE — ED Notes (Signed)
Patient transported to Xray in stable condition. 

## 2020-11-04 NOTE — ED Triage Notes (Signed)
Pt. Stated, Stephen Nielsen had some chest pain and back pain for the last week. When I cough its worse. 

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
## 2020-11-04 NOTE — Discharge Instructions (Signed)
Return for new or worsening symptoms

## 2021-01-03 ENCOUNTER — Emergency Department (HOSPITAL_COMMUNITY)
Admission: EM | Admit: 2021-01-03 | Discharge: 2021-01-03 | Disposition: A | Discharge status: Home / Self Care | Payer: Self-pay | Attending: Emergency Medicine | Admitting: Emergency Medicine

## 2021-01-03 ENCOUNTER — Encounter (HOSPITAL_COMMUNITY): Payer: Self-pay

## 2021-01-03 ENCOUNTER — Emergency Department (HOSPITAL_COMMUNITY): Payer: Self-pay

## 2021-01-03 ENCOUNTER — Other Ambulatory Visit: Payer: Self-pay

## 2021-01-03 DIAGNOSIS — Y9389 Activity, other specified: Secondary | ICD-10-CM | POA: Insufficient documentation

## 2021-01-03 DIAGNOSIS — R519 Headache, unspecified: Secondary | ICD-10-CM | POA: Insufficient documentation

## 2021-01-03 DIAGNOSIS — W000XXA Fall on same level due to ice and snow, initial encounter: Secondary | ICD-10-CM | POA: Insufficient documentation

## 2021-01-03 DIAGNOSIS — F17210 Nicotine dependence, cigarettes, uncomplicated: Secondary | ICD-10-CM | POA: Insufficient documentation

## 2021-01-03 DIAGNOSIS — W19XXXA Unspecified fall, initial encounter: Secondary | ICD-10-CM

## 2021-01-03 DIAGNOSIS — S6391XA Sprain of unspecified part of right wrist and hand, initial encounter: Secondary | ICD-10-CM | POA: Insufficient documentation

## 2021-01-03 DIAGNOSIS — Y92009 Unspecified place in unspecified non-institutional (private) residence as the place of occurrence of the external cause: Secondary | ICD-10-CM | POA: Insufficient documentation

## 2021-01-03 MED ORDER — IBUPROFEN 600 MG PO TABS: 600.0000 mg | ORAL_TABLET | Freq: Four times a day (QID) | ORAL | 0 refills | Status: DC | PRN

## 2021-01-03 MED ORDER — CYCLOBENZAPRINE HCL 10 MG PO TABS: 10.0000 mg | ORAL_TABLET | Freq: Two times a day (BID) | ORAL | 0 refills | Status: DC | PRN

## 2021-01-03 MED ORDER — IBUPROFEN 800 MG PO TABS
800.0000 mg | ORAL_TABLET | Freq: Once | ORAL | Status: AC
  Administered 2021-01-03: 800 mg via ORAL
  Filled 2021-01-03: qty 1

## 2021-01-03 NOTE — ED Notes (Signed)
Pt called for VS recheck no answer.

## 2021-01-03 NOTE — Discharge Instructions (Signed)
You have been evaluated for your fall. Your x-ray of your right hand did not show any obvious break in the bones. Your hand pain may be due to a sprain of your tendons and ligaments. Follow instruction below, follow-up with hand specialist as needed.
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
## 2021-01-03 NOTE — ED Triage Notes (Addendum)
Pt reports this morning he was taking out the trash and he slipped and fell on the concrete due to ice.Pt reports he hit his head and is c/o right hand pain & swelling. Pt unable to move hand but has radial pulse.Pt reports no blood thinners. Pt reports I feel fine my hand just hurts.pt is alert & oriented during triage.

## 2021-01-03 NOTE — ED Notes (Signed)
Ace wrap applied to pts hand.

## 2021-02-01 ENCOUNTER — Other Ambulatory Visit: Payer: Self-pay | Admitting: Occupational Medicine

## 2021-02-01 ENCOUNTER — Ambulatory Visit: Payer: Self-pay

## 2021-02-01 ENCOUNTER — Other Ambulatory Visit: Payer: Self-pay

## 2021-02-01 DIAGNOSIS — M79645 Pain in left finger(s): Secondary | ICD-10-CM

## 2021-02-01 MED ORDER — HYDROCODONE-ACETAMINOPHEN 5-325 MG PO TABS: 1.0000 | ORAL_TABLET | ORAL | 0 refills | Status: DC | PRN

## 2021-05-27 ENCOUNTER — Ambulatory Visit (HOSPITAL_COMMUNITY)
Admission: RE | Admit: 2021-05-27 | Discharge: 2021-05-27 | Disposition: A | Discharge status: Home / Self Care | Payer: Self-pay | Source: Ambulatory Visit | Attending: Physician Assistant | Admitting: Physician Assistant

## 2021-05-27 ENCOUNTER — Other Ambulatory Visit: Payer: Self-pay

## 2021-05-27 ENCOUNTER — Ambulatory Visit: Payer: Self-pay | Admitting: Physician Assistant

## 2021-05-27 VITALS — BP 125/82 | HR 87 | Temp 98.2°F | Resp 18 | Ht 69.0 in | Wt 154.0 lb

## 2021-05-27 DIAGNOSIS — R109 Unspecified abdominal pain: Secondary | ICD-10-CM | POA: Insufficient documentation

## 2021-05-27 DIAGNOSIS — Z1159 Encounter for screening for other viral diseases: Secondary | ICD-10-CM

## 2021-05-27 DIAGNOSIS — Z13228 Encounter for screening for other metabolic disorders: Secondary | ICD-10-CM

## 2021-05-27 DIAGNOSIS — Z125 Encounter for screening for malignant neoplasm of prostate: Secondary | ICD-10-CM

## 2021-05-27 DIAGNOSIS — Z114 Encounter for screening for human immunodeficiency virus [HIV]: Secondary | ICD-10-CM

## 2021-05-27 DIAGNOSIS — Z13220 Encounter for screening for lipoid disorders: Secondary | ICD-10-CM

## 2021-05-27 NOTE — Progress Notes (Signed)
Patient reports left side flank pain for the past 2 weeks with increased pain at night.  Patient has never experienced this pain prior to the onset. Patient denies any injury in the area Patient shares pain is at a 10 throughout the night and tender if he presses on the area. Patient has not taken medication and patient has eaten today.

## 2021-05-27 NOTE — Progress Notes (Signed)
New Patient Office Visit  Subjective:  Patient ID: Stephen Nielsen, male    DOB: 01/02/1971  Age: 50 y.o. MRN: [id number]  CC:  Chief Complaint  Patient presents with   Flank Pain    Left    HPI Stephen Nielsen presents for left sided pain right under his rib cage and right above his last rib for the past two weeks, tender when sleeping at night, hurts when he pushes on or sleeps on it.  Denies injury or trauma.  No changes in BM.  Denies shortness of breath  Has not tried anything for relief.   States that he had a nose bleed last week, states that was unusual for him, denies headache afterwards   Has been going to physical therapy for left hand finger injury     History reviewed. No pertinent past medical history.  History reviewed. No pertinent surgical history.  History reviewed. No pertinent family history.  Social History   Socioeconomic History   Marital status: Single    Spouse name: Not on file   Number of children: Not on file   Years of education: Not on file   Highest education level: Not on file  Occupational History   Not on file  Tobacco Use   Smoking status: Every Day    Packs/day: 0.50    Pack years: 0.00    Types: Cigarettes   Smokeless tobacco: Never  Substance and Sexual Activity   Alcohol use: Not Currently    Comment: 3 mths w/o   Drug use: No   Sexual activity: Not Currently  Other Topics Concern   Not on file  Social History Narrative   Not on file   Social Determinants of Health   Financial Resource Strain: Not on file  Food Insecurity: Not on file  Transportation Needs: Not on file  Physical Activity: Not on file  Stress: Not on file  Social Connections: Not on file  Intimate Partner Violence: Not on file    ROS Review of Systems  Constitutional:  Negative for chills, fatigue and fever.  HENT: Negative.    Eyes: Negative.   Respiratory:  Negative for cough, shortness of breath and wheezing.   Cardiovascular:   Negative for chest pain and palpitations.  Gastrointestinal:  Positive for abdominal pain. Negative for diarrhea, nausea and vomiting.  Endocrine: Negative.   Genitourinary:  Negative for difficulty urinating, dysuria and penile swelling.  Musculoskeletal:  Positive for arthralgias.  Skin: Negative.   Allergic/Immunologic: Negative.   Neurological:  Negative for headaches.  Hematological: Negative.   Psychiatric/Behavioral: Negative.     Objective:   Today's Vitals: BP (!) 135/98 (BP Location: Left Arm, Patient Position: Sitting, Cuff Size: Normal)   Pulse 87   Temp 98.2 F (36.8 C) (Oral)   Resp 18   Ht 5\' 9"  (1.753 m)   Wt 154 lb (69.9 kg)   SpO2 99%   BMI 22.74 kg/m   Physical Exam Vitals and nursing note reviewed.  Constitutional:      Appearance: Normal appearance.  HENT:     Head: Normocephalic and atraumatic.     Right Ear: External ear normal.     Left Ear: External ear normal.     Nose: Nose normal.     Mouth/Throat:     Mouth: Mucous membranes are moist.     Pharynx: Oropharynx is clear.  Eyes:     Extraocular Movements: Extraocular movements intact.     Conjunctiva/sclera: Conjunctivae normal.  Pupils: Pupils are equal, round, and reactive to light.  Cardiovascular:     Rate and Rhythm: Normal rate and regular rhythm.     Pulses: Normal pulses.     Heart sounds: Normal heart sounds.  Chest:     Chest wall: Tenderness present. No mass or swelling.    Abdominal:     General: Abdomen is flat. Bowel sounds are normal.     Palpations: Abdomen is soft.     Tenderness: There is abdominal tenderness in the left upper quadrant.    Musculoskeletal:        General: Normal range of motion.     Cervical back: Normal range of motion and neck supple.  Skin:    General: Skin is warm and dry.  Neurological:     General: No focal deficit present.     Mental Status: He is alert and oriented to person, place, and time.  Psychiatric:        Mood and Affect:  Mood normal.        Behavior: Behavior normal.        Thought Content: Thought content normal.        Judgment: Judgment normal.    Assessment & Plan:   Problem List Items Addressed This Visit       Other   Left sided abdominal pain - Primary   Relevant Orders   DG Abd Acute W/Chest   Other Visit Diagnoses     Screening for lipid disorders       Relevant Orders   Lipid panel   Screening for HIV (human immunodeficiency virus)       Relevant Orders   HIV antibody (with reflex)   Encounter for HCV screening test for low risk patient       Relevant Orders   HCV Ab w Reflex to Quant PCR   Screening PSA (prostate specific antigen)       Relevant Orders   PSA   Screening for metabolic disorder       Relevant Orders   CBC with Differential/Platelet   Comp. Metabolic Panel (12)   TSH       Outpatient Encounter Medications as of 05/27/2021  Medication Sig   cyclobenzaprine (FLEXERIL) 10 MG tablet Take 1 tablet (10 mg total) by mouth 2 (two) times daily as needed for muscle spasms. (Patient not taking: Reported on 05/27/2021)   HYDROcodone-acetaminophen (NORCO/VICODIN) 5-325 MG tablet Take 1 tablet by mouth every 4 (four) hours as needed. (Patient not taking: Reported on 05/27/2021)   ibuprofen (ADVIL) 600 MG tablet Take 1 tablet (600 mg total) by mouth every 6 (six) hours as needed. (Patient not taking: Reported on 05/27/2021)   lidocaine (LIDODERM) 5 % Place 1 patch onto the skin daily. Remove & Discard patch within 12 hours or as directed by MD (Patient not taking: Reported on 05/27/2021)   naproxen (NAPROSYN) 500 MG tablet Take 1 tablet (500 mg total) by mouth 2 (two) times daily. (Patient not taking: Reported on 05/27/2021)   [DISCONTINUED] benzonatate (TESSALON) 100 MG capsule Take 1 capsule (100 mg total) by mouth every 8 (eight) hours.   [DISCONTINUED] chlordiazePOXIDE (LIBRIUM) 25 MG capsule 50mg  PO TID x 1D, then 25-50mg  PO BID X 1D, then 25-50mg  PO QD X 1D (Patient not  taking: Reported on 11/04/2020)   No facility-administered encounter medications on file as of 05/27/2021.  1. Left sided abdominal pain Evaluate further with imaging.  Patient education given on supportive care.  Red flags  given for prompt reevaluation.  Patient given application for [place] financial assistance, [facility]  Patient to return to mobile unit tomorrow morning for fasting labs. - DG Abd Acute W/Chest; Future  2. Screening for lipid disorders  - Lipid panel; Future  3. Screening for HIV (human immunodeficiency virus)  - HIV antibody (with reflex); Future  4. Encounter for HCV screening test for low risk patient  - HCV Ab w Reflex to Quant PCR; Future  5. Screening PSA (prostate specific antigen)  - PSA; Future  6. Screening for metabolic disorder  - CBC with Differential/Platelet; Future - Comp. Metabolic Panel (12); Future - TSH; Future   I have reviewed the patient's medical history (PMH, PSH, Social History, Family History, Medications, and allergies) , and have been updated if relevant. I spent 31 minutes reviewing chart and  face to face time with patient.    Follow-up: Return in about 1 day (around 05/28/2021) for Fasting  labs.   Kasandra Knudsen Mayers, PA-C

## 2021-05-27 NOTE — Patient Instructions (Signed)
Please return to the mobile unit in the morning for fasting labs.  Once your lab results have returned and your x-ray results have returned, we will call you with the results and the next steps.  Roney Jaffe, PA-C Physician Assistant [facility] [email or web address]  Abdominal Pain, Adult Pain in the abdomen (abdominal pain) can be caused by many things. Often, abdominal pain is not serious and it gets better with no treatment or by being treated at home. However, sometimesabdominal pain is serious. Your health care provider will ask questions about your medical history and doa physical exam to try to determine the cause of your abdominal pain. Follow these instructions at home: Medicines Take over-the-counter and prescription medicines only as told by your health care provider. Do not take a laxative unless told by your health care provider. General instructions  Watch your condition for any changes. Drink enough fluid to keep your urine pale yellow. Keep all follow-up visits as told by your health care provider. This is important.  Contact a health care provider if: Your abdominal pain changes or gets worse. You are not hungry or you lose weight without trying. You are constipated or have diarrhea for more than 2-3 days. You have pain when you urinate or have a bowel movement. Your abdominal pain wakes you up at night. Your pain gets worse with meals, after eating, or with certain foods. You are vomiting and cannot keep anything down. You have a fever. You have blood in your urine. Get help right away if: Your pain does not go away as soon as your health care provider told you to expect. You cannot stop vomiting. Your pain is only in areas of the abdomen, such as the right side or the left lower portion of the abdomen. Pain on the right side could be caused by appendicitis. You have bloody or black stools, or stools that  look like tar. You have severe pain, cramping, or bloating in your abdomen. You have signs of dehydration, such as: Dark urine, very little urine, or no urine. Cracked lips. Dry mouth. Sunken eyes. Sleepiness. Weakness. You have trouble breathing or chest pain. Summary Often, abdominal pain is not serious and it gets better with no treatment or by being treated at home. However, sometimes abdominal pain is serious. Watch your condition for any changes. Take over-the-counter and prescription medicines only as told by your health care provider. Contact a health care provider if your abdominal pain changes or gets worse. Get help right away if you have severe pain, cramping, or bloating in your abdomen. This information is not intended to replace advice given to you by your health care provider. Make sure you discuss any questions you have with your healthcare provider. Document Revised: 01/20/2020 Document Reviewed: 04/11/2019 Elsevier Patient Education  2022 [employer].

## 2021-05-28 ENCOUNTER — Other Ambulatory Visit: Payer: Self-pay

## 2021-05-28 DIAGNOSIS — Z114 Encounter for screening for human immunodeficiency virus [HIV]: Secondary | ICD-10-CM

## 2021-05-28 DIAGNOSIS — Z125 Encounter for screening for malignant neoplasm of prostate: Secondary | ICD-10-CM

## 2021-05-28 DIAGNOSIS — Z1159 Encounter for screening for other viral diseases: Secondary | ICD-10-CM

## 2021-05-28 DIAGNOSIS — Z13220 Encounter for screening for lipoid disorders: Secondary | ICD-10-CM

## 2021-05-28 DIAGNOSIS — R7989 Other specified abnormal findings of blood chemistry: Secondary | ICD-10-CM

## 2021-05-28 DIAGNOSIS — Z13228 Encounter for screening for other metabolic disorders: Secondary | ICD-10-CM

## 2021-05-28 NOTE — Patient Instructions (Signed)
Patient is aware of receiving a follow up phone call on Monday regarding results.

## 2021-05-28 NOTE — Addendum Note (Signed)
Addended by: Roney Jaffe on: 05/28/2021 11:27 AM   Modules accepted: Orders

## 2021-05-28 NOTE — Progress Notes (Signed)
Patient tolerated blood draw well today and was made aware of his xray results via provider.  Patient has been scheduled for [place] and received reminder print out.

## 2021-05-29 ENCOUNTER — Telehealth: Payer: Self-pay | Admitting: *Deleted

## 2021-05-29 DIAGNOSIS — R7989 Other specified abnormal findings of blood chemistry: Secondary | ICD-10-CM | POA: Insufficient documentation

## 2021-05-29 LAB — COMP. METABOLIC PANEL (12)
AST: 16 IU/L (ref 0–40)
Albumin/Globulin Ratio: 1.7 (ref 1.2–2.2)
Albumin: 4.5 g/dL (ref 3.8–4.9)
Alkaline Phosphatase: 56 IU/L (ref 44–121)
BUN/Creatinine Ratio: 17 (ref 9–20)
BUN: 12 mg/dL (ref 6–24)
Bilirubin Total: 0.5 mg/dL (ref 0.0–1.2)
Calcium: 9.9 mg/dL (ref 8.7–10.2)
Chloride: 101 mmol/L (ref 96–106)
Creatinine, Ser: 0.69 mg/dL — ABNORMAL LOW (ref 0.76–1.27)
Globulin, Total: 2.7 g/dL (ref 1.5–4.5)
Glucose: 117 mg/dL — ABNORMAL HIGH (ref 65–99)
Potassium: 3.9 mmol/L (ref 3.5–5.2)
Sodium: 139 mmol/L (ref 134–144)
Total Protein: 7.2 g/dL (ref 6.0–8.5)
eGFR: 112 mL/min/{1.73_m2} (ref >59)

## 2021-05-29 LAB — LIPID PANEL
Chol/HDL Ratio: 2 ratio (ref 0.0–5.0)
Cholesterol, Total: 166 mg/dL (ref 100–199)
HDL: 85 mg/dL (ref >39)
LDL Chol Calc (NIH): 72 mg/dL (ref 0–99)
Triglycerides: 39 mg/dL (ref 0–149)
VLDL Cholesterol Cal: 9 mg/dL (ref 5–40)

## 2021-05-29 LAB — PSA: Prostate Specific Ag, Serum: 1.1 ng/mL (ref 0.0–4.0)

## 2021-05-29 LAB — CBC WITH DIFFERENTIAL/PLATELET
Basophils Absolute: 0 10*3/uL (ref 0.0–0.2)
Basos: 1 %
EOS (ABSOLUTE): 0 10*3/uL (ref 0.0–0.4)
Eos: 1 %
Hematocrit: 40.9 % (ref 37.5–51.0)
Hemoglobin: 13.5 g/dL (ref 13.0–17.7)
Immature Grans (Abs): 0 10*3/uL (ref 0.0–0.1)
Immature Granulocytes: 0 %
Lymphocytes Absolute: 1.6 10*3/uL (ref 0.7–3.1)
Lymphs: 29 %
MCH: 32.7 pg (ref 26.6–33.0)
MCHC: 33 g/dL (ref 31.5–35.7)
MCV: 99 fL — ABNORMAL HIGH (ref 79–97)
Monocytes Absolute: 0.6 10*3/uL (ref 0.1–0.9)
Monocytes: 11 %
Neutrophils Absolute: 3.2 10*3/uL (ref 1.4–7.0)
Neutrophils: 58 %
Platelets: 175 10*3/uL (ref 150–450)
RBC: 4.13 x10E6/uL — ABNORMAL LOW (ref 4.14–5.80)
RDW: 11.5 % — ABNORMAL LOW (ref 11.6–15.4)
WBC: 5.5 10*3/uL (ref 3.4–10.8)

## 2021-05-29 LAB — HCV INTERPRETATION

## 2021-05-29 LAB — HCV AB W REFLEX TO QUANT PCR: HCV Ab: <0.1 s/co ratio (ref 0.0–0.9)

## 2021-05-29 LAB — TSH: TSH: 0.421 u[IU]/mL — ABNORMAL LOW (ref 0.450–4.500)

## 2021-05-29 LAB — HIV ANTIBODY (ROUTINE TESTING W REFLEX): HIV Screen 4th Generation wRfx: NONREACTIVE

## 2021-05-29 NOTE — Addendum Note (Signed)
Addended by: Roney Jaffe on: 05/29/2021 03:26 PM   Modules accepted: Orders

## 2021-05-29 NOTE — Telephone Encounter (Signed)
Patient verified DOB Patient is aware of labs being normal and screenings being negative. Patient will return on tomorrow for [facility] further evaluation.

## 2021-05-29 NOTE — Telephone Encounter (Signed)
-----   Message from Roney Jaffe, [place] sent at 05/29/2021  3:26 PM EDT ----- Please call patient and let him know that his liver function is within normal limits, his kidney function is slightly decreased, he needs to increase his hydration.  His screening for hepatitis C and prostate cancer were negative.  His cholesterol is well controlled.  He does not show signs of anemia.  His thyroid test was abnormal.  He does need to return for follow-up labs for his thyroid, he does not need to be fasting .

## 2021-05-30 ENCOUNTER — Other Ambulatory Visit: Payer: Self-pay

## 2021-05-30 DIAGNOSIS — R7989 Other specified abnormal findings of blood chemistry: Secondary | ICD-10-CM

## 2021-05-30 NOTE — Progress Notes (Signed)
Patient verified DOB Patient tolerated blood draw well today.

## 2021-05-30 NOTE — Addendum Note (Signed)
Addended by: Margaretmary Lombard on: 05/30/2021 12:11 PM   Modules accepted: Orders

## 2021-05-31 LAB — BASIC METABOLIC PANEL
BUN/Creatinine Ratio: 16 (ref 9–20)
BUN: 12 mg/dL (ref 6–24)
CO2: 24 mmol/L (ref 20–29)
Calcium: 9.5 mg/dL (ref 8.7–10.2)
Chloride: 101 mmol/L (ref 96–106)
Creatinine, Ser: 0.77 mg/dL (ref 0.76–1.27)
Glucose: 166 mg/dL — ABNORMAL HIGH (ref 65–99)
Potassium: 3.7 mmol/L (ref 3.5–5.2)
Sodium: 139 mmol/L (ref 134–144)
eGFR: 108 mL/min/{1.73_m2} (ref >59)

## 2021-05-31 LAB — THYROID PANEL WITH TSH
Free Thyroxine Index: 1.4 (ref 1.2–4.9)
T3 Uptake Ratio: 29 % (ref 24–39)
T4, Total: 4.8 ug/dL (ref 4.5–12.0)
TSH: 0.57 u[IU]/mL (ref 0.450–4.500)

## 2021-06-03 ENCOUNTER — Telehealth: Payer: Self-pay | Admitting: *Deleted

## 2021-06-03 NOTE — Telephone Encounter (Signed)
Patient verified DOB Patient is aware of further evaluation of thyroid being normal. Patient is aware of elevated glucose levels in blood and to follow up after [place] appointment on the 23rd for an A1C check. Patient will follow up with [facility] on Monday.

## 2021-06-06 ENCOUNTER — Other Ambulatory Visit: Payer: Self-pay

## 2021-06-06 ENCOUNTER — Ambulatory Visit (HOSPITAL_COMMUNITY)
Admission: RE | Admit: 2021-06-06 | Discharge: 2021-06-06 | Disposition: A | Discharge status: Home / Self Care | Payer: Self-pay | Source: Ambulatory Visit | Attending: Physician Assistant | Admitting: Physician Assistant

## 2021-06-06 DIAGNOSIS — R109 Unspecified abdominal pain: Secondary | ICD-10-CM | POA: Insufficient documentation

## 2021-06-10 ENCOUNTER — Telehealth: Payer: Self-pay | Admitting: *Deleted

## 2021-06-10 NOTE — Telephone Encounter (Signed)
-----   Message from Roney Jaffe, [place] sent at 06/08/2021  7:13 AM EDT ----- Please call patient and let him know that his ultrasound did not show any abnormalities or potential causes of his discomfort.

## 2021-06-10 NOTE — Telephone Encounter (Signed)
Medical Assistant left message on patient's home and cell voicemail. Voicemail states to give a call back to [place] with [facility] at [phone]. Patient is aware of no abnormalities being noted and to follow up if pain persisted or worsened.

## 2021-07-30 ENCOUNTER — Ambulatory Visit: Payer: Self-pay | Admitting: Internal Medicine

## 2021-10-07 ENCOUNTER — Other Ambulatory Visit: Payer: Self-pay

## 2021-10-07 ENCOUNTER — Emergency Department (HOSPITAL_COMMUNITY)
Admission: EM | Admit: 2021-10-07 | Discharge: 2021-10-08 | Disposition: A | Discharge status: Home / Self Care | Payer: Self-pay | Attending: Emergency Medicine | Admitting: Emergency Medicine

## 2021-10-07 DIAGNOSIS — M7918 Myalgia, other site: Secondary | ICD-10-CM | POA: Insufficient documentation

## 2021-10-07 DIAGNOSIS — R112 Nausea with vomiting, unspecified: Secondary | ICD-10-CM | POA: Insufficient documentation

## 2021-10-07 DIAGNOSIS — K760 Fatty (change of) liver, not elsewhere classified: Secondary | ICD-10-CM

## 2021-10-07 DIAGNOSIS — F17210 Nicotine dependence, cigarettes, uncomplicated: Secondary | ICD-10-CM | POA: Insufficient documentation

## 2021-10-07 DIAGNOSIS — Z79899 Other long term (current) drug therapy: Secondary | ICD-10-CM | POA: Insufficient documentation

## 2021-10-07 DIAGNOSIS — R1011 Right upper quadrant pain: Secondary | ICD-10-CM | POA: Insufficient documentation

## 2021-10-07 DIAGNOSIS — J029 Acute pharyngitis, unspecified: Secondary | ICD-10-CM | POA: Insufficient documentation

## 2021-10-07 DIAGNOSIS — R519 Headache, unspecified: Secondary | ICD-10-CM | POA: Insufficient documentation

## 2021-10-07 DIAGNOSIS — R0981 Nasal congestion: Secondary | ICD-10-CM | POA: Insufficient documentation

## 2021-10-07 DIAGNOSIS — Z20822 Contact with and (suspected) exposure to covid-19: Secondary | ICD-10-CM | POA: Insufficient documentation

## 2021-10-07 LAB — COMPREHENSIVE METABOLIC PANEL
ALT: 54 U/L — ABNORMAL HIGH (ref 0–44)
AST: 89 U/L — ABNORMAL HIGH (ref 15–41)
Albumin: 4.4 g/dL (ref 3.5–5.0)
Alkaline Phosphatase: 53 U/L (ref 38–126)
Anion gap: 14 (ref 5–15)
BUN: 8 mg/dL (ref 6–20)
CO2: 29 mmol/L (ref 22–32)
Calcium: 10 mg/dL (ref 8.9–10.3)
Chloride: 92 mmol/L — ABNORMAL LOW (ref 98–111)
Creatinine, Ser: 0.96 mg/dL (ref 0.61–1.24)
GFR, Estimated: >60 mL/min (ref >60)
Glucose, Bld: 122 mg/dL — ABNORMAL HIGH (ref 70–99)
Potassium: 3.8 mmol/L (ref 3.5–5.1)
Sodium: 135 mmol/L (ref 135–145)
Total Bilirubin: 2.1 mg/dL — ABNORMAL HIGH (ref 0.3–1.2)
Total Protein: 7.4 g/dL (ref 6.5–8.1)

## 2021-10-07 LAB — LIPASE, BLOOD: Lipase: 21 U/L (ref 11–51)

## 2021-10-07 LAB — RESP PANEL BY RT-PCR (FLU A&B, COVID) ARPGX2
Influenza A by PCR: NEGATIVE
Influenza B by PCR: NEGATIVE
SARS Coronavirus 2 by RT PCR: NEGATIVE

## 2021-10-07 LAB — CBC
HCT: 40.9 % (ref 39.0–52.0)
Hemoglobin: 13.7 g/dL (ref 13.0–17.0)
MCH: 31.9 pg (ref 26.0–34.0)
MCHC: 33.5 g/dL (ref 30.0–36.0)
MCV: 95.3 fL (ref 80.0–100.0)
Platelets: 190 10*3/uL (ref 150–400)
RBC: 4.29 MIL/uL (ref 4.22–5.81)
RDW: 15.3 % (ref 11.5–15.5)
WBC: 7.7 10*3/uL (ref 4.0–10.5)
nRBC: 0 % (ref 0.0–0.2)

## 2021-10-07 NOTE — ED Triage Notes (Signed)
Pt endorses two days of n/v, generalized abdominal pain, sore throat, generalized body aches, and nasal congestion.

## 2021-10-08 ENCOUNTER — Emergency Department (HOSPITAL_COMMUNITY): Payer: Self-pay

## 2021-10-08 LAB — URINALYSIS, ROUTINE W REFLEX MICROSCOPIC
Bacteria, UA: NONE SEEN
Bilirubin Urine: NEGATIVE
Glucose, UA: NEGATIVE mg/dL
Ketones, ur: 80 mg/dL — AB
Leukocytes,Ua: NEGATIVE
Nitrite: NEGATIVE
Protein, ur: 100 mg/dL — AB
RBC / HPF: >50 RBC/hpf — ABNORMAL HIGH (ref 0–5)
Specific Gravity, Urine: 1.028 (ref 1.005–1.030)
pH: 7 (ref 5.0–8.0)

## 2021-10-08 LAB — GROUP A STREP BY PCR: Group A Strep by PCR: NOT DETECTED

## 2021-10-08 MED ORDER — ONDANSETRON HCL 4 MG/2ML IJ SOLN
4.0000 mg | Freq: Once | INTRAMUSCULAR | Status: AC
  Administered 2021-10-08: 4 mg via INTRAVENOUS
  Filled 2021-10-08: qty 2

## 2021-10-08 MED ORDER — ONDANSETRON 4 MG PO TBDP: 4.0000 mg | ORAL_TABLET | Freq: Three times a day (TID) | ORAL | 0 refills | Status: DC | PRN

## 2021-10-08 MED ORDER — SODIUM CHLORIDE 0.9 % IV BOLUS
1000.0000 mL | Freq: Once | INTRAVENOUS | Status: AC
  Administered 2021-10-08: 1000 mL via INTRAVENOUS
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
## 2021-10-08 NOTE — Discharge Instructions (Addendum)
Take Zofran every 8 hours as needed for nausea and vomiting.  Follow-up with urology regarding the blood in your urine, I would schedule follow-up with them in the next week or so.  Return if things change or worsen.

## 2021-11-17 ENCOUNTER — Encounter (HOSPITAL_COMMUNITY): Payer: Self-pay | Admitting: Emergency Medicine

## 2021-11-17 ENCOUNTER — Emergency Department (HOSPITAL_COMMUNITY): Payer: Self-pay

## 2021-11-17 ENCOUNTER — Other Ambulatory Visit: Payer: Self-pay

## 2021-11-17 ENCOUNTER — Emergency Department (HOSPITAL_COMMUNITY)
Admission: EM | Admit: 2021-11-17 | Discharge: 2021-11-18 | Disposition: A | Discharge status: Home / Self Care | Payer: Self-pay | Attending: Emergency Medicine | Admitting: Emergency Medicine

## 2021-11-17 DIAGNOSIS — M7022 Olecranon bursitis, left elbow: Secondary | ICD-10-CM | POA: Insufficient documentation

## 2021-11-17 DIAGNOSIS — Y999 Unspecified external cause status: Secondary | ICD-10-CM | POA: Insufficient documentation

## 2021-11-17 DIAGNOSIS — F17210 Nicotine dependence, cigarettes, uncomplicated: Secondary | ICD-10-CM | POA: Insufficient documentation

## 2021-11-17 DIAGNOSIS — Y939 Activity, unspecified: Secondary | ICD-10-CM | POA: Insufficient documentation

## 2021-11-17 DIAGNOSIS — Z79899 Other long term (current) drug therapy: Secondary | ICD-10-CM | POA: Insufficient documentation

## 2021-11-17 LAB — CBC WITH DIFFERENTIAL/PLATELET
Abs Immature Granulocytes: 0.02 10*3/uL (ref 0.00–0.07)
Basophils Absolute: 0 10*3/uL (ref 0.0–0.1)
Basophils Relative: 0 %
Eosinophils Absolute: 0.1 10*3/uL (ref 0.0–0.5)
Eosinophils Relative: 1 %
HCT: 37.9 % — ABNORMAL LOW (ref 39.0–52.0)
Hemoglobin: 12.7 g/dL — ABNORMAL LOW (ref 13.0–17.0)
Immature Granulocytes: 0 %
Lymphocytes Relative: 29 %
Lymphs Abs: 1.9 10*3/uL (ref 0.7–4.0)
MCH: 33.2 pg (ref 26.0–34.0)
MCHC: 33.5 g/dL (ref 30.0–36.0)
MCV: 99.2 fL (ref 80.0–100.0)
Monocytes Absolute: 0.7 10*3/uL (ref 0.1–1.0)
Monocytes Relative: 10 %
Neutro Abs: 4.1 10*3/uL (ref 1.7–7.7)
Neutrophils Relative %: 60 %
Platelets: 151 10*3/uL (ref 150–400)
RBC: 3.82 MIL/uL — ABNORMAL LOW (ref 4.22–5.81)
RDW: 14.7 % (ref 11.5–15.5)
WBC: 6.8 10*3/uL (ref 4.0–10.5)
nRBC: 0 % (ref 0.0–0.2)

## 2021-11-17 LAB — BASIC METABOLIC PANEL
Anion gap: 7 (ref 5–15)
BUN: 14 mg/dL (ref 6–20)
CO2: 26 mmol/L (ref 22–32)
Calcium: 9.5 mg/dL (ref 8.9–10.3)
Chloride: 102 mmol/L (ref 98–111)
Creatinine, Ser: 0.7 mg/dL (ref 0.61–1.24)
GFR, Estimated: >60 mL/min (ref >60)
Glucose, Bld: 118 mg/dL — ABNORMAL HIGH (ref 70–99)
Potassium: 3.5 mmol/L (ref 3.5–5.1)
Sodium: 135 mmol/L (ref 135–145)

## 2021-11-17 NOTE — ED Triage Notes (Signed)
C/o knot on L elbow that he noticed yesterday.  Denies pain.

## 2021-11-17 NOTE — ED Provider Notes (Signed)
Emergency Medicine Provider Triage Evaluation Note  Stephen Nielsen , a 50 y.o. male  was evaluated in triage.  Patient states that he had a mass that appeared on his left elbow about 2 weeks ago.  Is gradually gotten bigger.  It is not painful.  Denies any numbness or tingling to that extremity.  Review of Systems  Positive: See above Negative:   Physical Exam  There were no vitals taken for this visit. Gen:   Awake, no distress   Resp:  Normal effort  MSK:   Moves extremities without difficulty  Other:  Left posterior elbow large mass. Soft. Mobile. Nonpulsatile. Radial pulse 2+ bilateral. Good skin temperature.   Medical Decision Making  Medically screening exam initiated at 6:03 PM.  Appropriate orders placed.  Stephen Nielsen was informed that the remainder of the evaluation will be completed by another provider, this initial triage assessment does not replace that evaluation, and the importance of remaining in the ED until their evaluation is complete.     Stephen Nielsen 11/17/21 1805    Stephen Munch, MD 11/17/21 [phone]

## 2021-11-18 MED ORDER — SULFAMETHOXAZOLE-TRIMETHOPRIM 400-80 MG PO TABS: 1.0000 | ORAL_TABLET | Freq: Two times a day (BID) | ORAL | 0 refills | Status: DC

## 2021-11-18 NOTE — Discharge Instructions (Addendum)
You were seen in the ED for a left elbow mass.  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  For this, recommend taking over-the-counter pain medications as needed.  I have also ordered you a sling to wear on the left elbow.  I am also prescribing you an antibiotic called Bactrim.  You will take this twice a day for the next 10 days.  We also recommend that you follow-up with orthopedics for this.  To arrange an appointment with them, please call [phone].  Please return to the emergency department for any worsening symptoms including high fevers, chills, severe joint pain.

## 2021-11-18 NOTE — ED Notes (Signed)
Patient given discharge instructions. Questions were answered. Patient verbalized understanding of discharge instructions and care at home.  
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
## 2022-05-08 ENCOUNTER — Emergency Department (HOSPITAL_COMMUNITY)
Admission: EM | Admit: 2022-05-08 | Discharge: 2022-05-08 | Disposition: A | Discharge status: Home / Self Care | Payer: Self-pay | Attending: Emergency Medicine | Admitting: Emergency Medicine

## 2022-05-08 ENCOUNTER — Other Ambulatory Visit: Payer: Self-pay

## 2022-05-08 ENCOUNTER — Emergency Department (HOSPITAL_COMMUNITY): Payer: Self-pay

## 2022-05-08 ENCOUNTER — Encounter (HOSPITAL_COMMUNITY): Payer: Self-pay

## 2022-05-08 DIAGNOSIS — M25512 Pain in left shoulder: Secondary | ICD-10-CM | POA: Insufficient documentation

## 2022-05-08 DIAGNOSIS — R0989 Other specified symptoms and signs involving the circulatory and respiratory systems: Secondary | ICD-10-CM | POA: Insufficient documentation

## 2022-05-08 DIAGNOSIS — Z20822 Contact with and (suspected) exposure to covid-19: Secondary | ICD-10-CM | POA: Insufficient documentation

## 2022-05-08 DIAGNOSIS — F17200 Nicotine dependence, unspecified, uncomplicated: Secondary | ICD-10-CM | POA: Insufficient documentation

## 2022-05-08 DIAGNOSIS — R112 Nausea with vomiting, unspecified: Secondary | ICD-10-CM

## 2022-05-08 DIAGNOSIS — E860 Dehydration: Secondary | ICD-10-CM | POA: Insufficient documentation

## 2022-05-08 DIAGNOSIS — R059 Cough, unspecified: Secondary | ICD-10-CM | POA: Insufficient documentation

## 2022-05-08 DIAGNOSIS — R000 Tachycardia, unspecified: Secondary | ICD-10-CM | POA: Insufficient documentation

## 2022-05-08 LAB — COMPREHENSIVE METABOLIC PANEL
ALT: 32 U/L (ref 0–44)
AST: 69 U/L — ABNORMAL HIGH (ref 15–41)
Albumin: 4.4 g/dL (ref 3.5–5.0)
Alkaline Phosphatase: 59 U/L (ref 38–126)
Anion gap: 16 — ABNORMAL HIGH (ref 5–15)
BUN: 9 mg/dL (ref 6–20)
CO2: 25 mmol/L (ref 22–32)
Calcium: 9.3 mg/dL (ref 8.9–10.3)
Chloride: 99 mmol/L (ref 98–111)
Creatinine, Ser: 0.81 mg/dL (ref 0.61–1.24)
GFR, Estimated: >60 mL/min (ref >60)
Glucose, Bld: 97 mg/dL (ref 70–99)
Potassium: 3.5 mmol/L (ref 3.5–5.1)
Sodium: 140 mmol/L (ref 135–145)
Total Bilirubin: 1 mg/dL (ref 0.3–1.2)
Total Protein: 7.5 g/dL (ref 6.5–8.1)

## 2022-05-08 LAB — CBC WITH DIFFERENTIAL/PLATELET
Abs Immature Granulocytes: 0.01 10*3/uL (ref 0.00–0.07)
Basophils Absolute: 0 10*3/uL (ref 0.0–0.1)
Basophils Relative: 1 %
Eosinophils Absolute: 0 10*3/uL (ref 0.0–0.5)
Eosinophils Relative: 0 %
HCT: 43.2 % (ref 39.0–52.0)
Hemoglobin: 15 g/dL (ref 13.0–17.0)
Immature Granulocytes: 0 %
Lymphocytes Relative: 22 %
Lymphs Abs: 1.1 10*3/uL (ref 0.7–4.0)
MCH: 34.1 pg — ABNORMAL HIGH (ref 26.0–34.0)
MCHC: 34.7 g/dL (ref 30.0–36.0)
MCV: 98.2 fL (ref 80.0–100.0)
Monocytes Absolute: 0.6 10*3/uL (ref 0.1–1.0)
Monocytes Relative: 11 %
Neutro Abs: 3.3 10*3/uL (ref 1.7–7.7)
Neutrophils Relative %: 66 %
Platelets: 122 10*3/uL — ABNORMAL LOW (ref 150–400)
RBC: 4.4 MIL/uL (ref 4.22–5.81)
RDW: 14.3 % (ref 11.5–15.5)
WBC: 5 10*3/uL (ref 4.0–10.5)
nRBC: 0 % (ref 0.0–0.2)

## 2022-05-08 LAB — RESP PANEL BY RT-PCR (FLU A&B, COVID) ARPGX2
Influenza A by PCR: NEGATIVE
Influenza B by PCR: NEGATIVE
SARS Coronavirus 2 by RT PCR: NEGATIVE

## 2022-05-08 LAB — LIPASE, BLOOD: Lipase: 21 U/L (ref 11–51)

## 2022-05-08 MED ORDER — ONDANSETRON HCL 4 MG/2ML IJ SOLN
4.0000 mg | Freq: Once | INTRAMUSCULAR | Status: AC
  Administered 2022-05-08: 4 mg via INTRAVENOUS
  Filled 2022-05-08: qty 2

## 2022-05-08 MED ORDER — ONDANSETRON 4 MG PO TBDP: 4.0000 mg | ORAL_TABLET | Freq: Three times a day (TID) | ORAL | 0 refills | Status: DC | PRN

## 2022-05-08 MED ORDER — IOHEXOL 350 MG/ML SOLN
100.0000 mL | Freq: Once | INTRAVENOUS | Status: AC | PRN
  Administered 2022-05-08: 100 mL via INTRAVENOUS

## 2022-05-08 MED ORDER — PANTOPRAZOLE SODIUM 40 MG IV SOLR
40.0000 mg | Freq: Once | INTRAVENOUS | Status: AC
  Administered 2022-05-08: 40 mg via INTRAVENOUS
  Filled 2022-05-08: qty 10

## 2022-05-08 MED ORDER — DICYCLOMINE HCL 20 MG PO TABS: 20.0000 mg | ORAL_TABLET | Freq: Three times a day (TID) | ORAL | 0 refills | Status: DC | PRN

## 2022-05-08 MED ORDER — SODIUM CHLORIDE 0.9 % IV BOLUS
1000.0000 mL | Freq: Once | INTRAVENOUS | Status: AC
  Administered 2022-05-08: 1000 mL via INTRAVENOUS

## 2022-05-08 NOTE — Discharge Instructions (Signed)

## 2022-05-08 NOTE — ED Provider Notes (Signed)
Emergency Department Provider Note   I have reviewed the triage vital signs and the nursing notes.   HISTORY  Chief Complaint Emesis and Dizziness   HPI Stephen Nielsen is a 51 y.o. male presents to the emergency department for evaluation of nausea and vomiting for the past 3 days.  He is not having abdominal or chest discomfort.  Reports some mild soreness in the left shoulder but nothing particularly reproducible or exertional.  He has been having nausea and vomiting with some runny nose and mild cough.  He is not having diarrhea symptoms.  No fevers or chills.  No known sick contacts.    History reviewed. No pertinent past medical history.  Review of Systems  Constitutional: No fever/chills Cardiovascular: Denies chest pain. Respiratory: Denies shortness of breath. Gastrointestinal: No abdominal pain.  Positive nausea and vomiting.  No diarrhea.  No constipation. Genitourinary: Negative for dysuria. Musculoskeletal: Negative for back pain. Skin: Negative for rash. Neurological: Negative for headaches.   ____________________________________________   PHYSICAL EXAM:  VITAL SIGNS: ED Triage Vitals [05/08/22 0844]  Enc Vitals Group     BP (!) 170/98     Pulse Rate (!) 110     Resp 18     Temp 98.6 F (37 C)     Temp Source Oral     SpO2 98 %     Weight 150 lb (68 kg)     Height 5\' 9"  (1.753 m)   Constitutional: Alert and oriented. Well appearing and in no acute distress. Eyes: Conjunctivae are normal.  Head: Atraumatic. Nose: No congestion/rhinnorhea. Mouth/Throat: Mucous membranes are moist.   Neck: No stridor.  Cardiovascular: Tachycardia. Good peripheral circulation. Grossly normal heart sounds.   Respiratory: Normal respiratory effort.  No retractions. Lungs CTAB. Gastrointestinal: Soft with mild diffuse tenderness. No focal tenderness, masses, or peritonitis. No distention.  Musculoskeletal: No gross deformities of extremities. Neurologic:  Normal  speech and language. Skin:  Skin is warm, dry and intact. No rash noted.  ____________________________________________   LABS (all labs ordered are listed, but only abnormal results are displayed)  Labs Reviewed  COMPREHENSIVE METABOLIC PANEL - Abnormal; Notable for the following components:      Result Value   AST 69 (*)    Anion gap 16 (*)    All other components within normal limits  CBC WITH DIFFERENTIAL/PLATELET - Abnormal; Notable for the following components:   MCH 34.1 (*)    Platelets 122 (*)    All other components within normal limits  RESP PANEL BY RT-PCR (FLU A&B, COVID) ARPGX2  LIPASE, BLOOD   ____________________________________________  RADIOLOGY  CT ABDOMEN PELVIS W CONTRAST  Result Date: 05/08/2022 CLINICAL DATA:  Abdominal pain EXAM: CT ABDOMEN AND PELVIS WITH CONTRAST TECHNIQUE: Multidetector CT imaging of the abdomen and pelvis was performed using the standard protocol following bolus administration of intravenous contrast. RADIATION DOSE REDUCTION: This exam was performed according to the departmental dose-optimization program which includes automated exposure control, adjustment of the mA and/or kV according to patient size and/or use of iterative reconstruction technique. CONTRAST:  05/10/2022 OMNIPAQUE IOHEXOL 350 MG/ML SOLN COMPARISON:  CT abdomen and pelvis 10/08/2021 FINDINGS: Lower chest: No acute abnormality. Hepatobiliary: Liver is normal in size and contour with diffusely decreased attenuation of the parenchyma consistent with steatosis. No suspicious hepatic mass visualized. Gallbladder appears within normal limits. No biliary ductal dilatation. Pancreas: Unremarkable. No pancreatic ductal dilatation or surrounding inflammatory changes. Spleen: Normal in size without focal abnormality. Adrenals/Urinary Tract: Adrenal glands  appear normal. A few small hypodense renal cysts bilaterally measuring up to 11 mm on the right. No hydronephrosis. Urinary bladder appears  normal. Stomach/Bowel: No bowel obstruction, free air or pneumatosis. No bowel wall edema. Appendix is normal. Vascular/Lymphatic: Aortic atherosclerosis. No enlarged abdominal or pelvic lymph nodes. Reproductive: Prostate gland is mildly enlarged with mild indentation on the base of the urinary bladder. Other: No ascites. Musculoskeletal: No suspicious bony lesions identified. IMPRESSION: 1. No acute process identified. 2. Marked hepatic steatosis. 3. Prostatomegaly. 4. Renal cysts. Electronically Signed   By: Jannifer Hickelaney  Williams M.D.   On: 05/08/2022 12:44    ____________________________________________   PROCEDURES  Procedure(s) performed:   Procedures  None  ____________________________________________   INITIAL IMPRESSION / ASSESSMENT AND PLAN / ED COURSE  Pertinent labs & imaging results that were available during my care of the patient were reviewed by me and considered in my medical decision making (see chart for details).   This patient is Presenting for Evaluation of nausea/vomiting, which does require a range of treatment options, and is a complaint that involves a high risk of morbidity and mortality.  The Differential Diagnoses includes but is not exclusive to acute cholecystitis, intrathoracic causes for epigastric abdominal pain, gastritis, duodenitis, pancreatitis, small bowel or large bowel obstruction, abdominal aortic aneurysm, hernia, gastritis, etc.   Critical Interventions-    Medications  sodium chloride 0.9 % bolus 1,000 mL (0 mLs Intravenous Stopped 05/08/22 0956)  ondansetron (ZOFRAN) injection 4 mg (4 mg Intravenous Given 05/08/22 0912)  iohexol (OMNIPAQUE) 350 MG/ML injection 100 mL (100 mLs Intravenous Contrast Given 05/08/22 1230)  ondansetron (ZOFRAN) injection 4 mg (4 mg Intravenous Given 05/08/22 1418)  pantoprazole (PROTONIX) injection 40 mg (40 mg Intravenous Given 05/08/22 1418)    Reassessment after intervention: Symptoms improved. Tachycardia improved.     I decided to review pertinent External Data, and in summary similar presentation in October 2022. CT renal at that time with no acute process. Chronic elevation of LFTs and bilirubin with hepatic steatosis.    Clinical Laboratory Tests Ordered, included COVID and flu were negative.  No leukocytosis or anemia.  No acute kidney injury or electrolyte disturbance.  Lipase normal.   Radiologic Tests Ordered, included CT abdomen/pelvis. I independently interpreted the images and agree with radiology interpretation.   Cardiac Monitor Tracing which shows NSR.    Social Determinants of Health Risk patient with a smoking history.    Medical Decision Making: Summary:  Patient presents to the emergency department for evaluation of nausea and vomiting.  No focal abdominal tenderness on exam.  Has had similar ED presentations in the past.  Hold on imaging for now but will obtain screening blood work, IV fluids, Zofran and reassess.  Reevaluation with update and discussion with patient.  CT imaging with no acute findings.  He does have hepatic steatosis.  Labs are similarly reassuring.  Vital signs normalized after IV fluids and patient feeling better after Zofran.  He is drinking fluids and eating crackers.  Suspect underlying viral process.  Discussed symptom management at home with mainly p.o. fluids and PCP follow-up.  Discussed ED return precautions.  Patient is comfortable with the plan at discharge. Considered admission but with improved vitals and reassuring labs/imaging seems appropriate for d/c.   Disposition: discharge  ____________________________________________  FINAL CLINICAL IMPRESSION(S) / ED DIAGNOSES  Final diagnoses:  Nausea and vomiting, unspecified vomiting type  Dehydration     NEW OUTPATIENT MEDICATIONS STARTED DURING THIS VISIT:  Discharge Medication List as of  05/08/2022  2:03 PM     START taking these medications   Details  dicyclomine (BENTYL) 20 MG tablet Take 1  tablet (20 mg total) by mouth 3 (three) times daily as needed for spasms (abdominal cramping)., Starting Thu 05/08/2022, Normal        Note:  This document was prepared using Dragon voice recognition software and may include unintentional dictation errors.  Alona Bene, MD, [facility] Emergency Medicine    Palmina Clodfelter, Arlyss Repress, MD 05/11/22 1239

## 2022-05-08 NOTE — ED Triage Notes (Signed)
Pt arrived POV from home stating he has been throwing up for 3 days, unable to keep anything down and now he feels lightheaded and weak. Pt endorses left shoulder pain.

## 2022-05-08 NOTE — ED Notes (Signed)
Got patient some saltines and a ginger ale patient is sitting up eating and drinking with call bell in reach

## 2022-05-08 NOTE — ED Notes (Signed)
Pt sitting in bed c/o vomiting x3 days with a non-productive cough. Pt denies abd pain and diarrhea.

## 2022-10-03 ENCOUNTER — Encounter (HOSPITAL_COMMUNITY): Payer: Self-pay

## 2022-10-03 ENCOUNTER — Other Ambulatory Visit: Payer: Self-pay

## 2022-10-03 ENCOUNTER — Emergency Department (HOSPITAL_COMMUNITY)
Admission: EM | Admit: 2022-10-03 | Discharge: 2022-10-03 | Discharge status: Against Medical Advice | Payer: Self-pay | Attending: Emergency Medicine | Admitting: Emergency Medicine

## 2022-10-03 DIAGNOSIS — R531 Weakness: Secondary | ICD-10-CM | POA: Insufficient documentation

## 2022-10-03 DIAGNOSIS — R059 Cough, unspecified: Secondary | ICD-10-CM | POA: Insufficient documentation

## 2022-10-03 DIAGNOSIS — Z5321 Procedure and treatment not carried out due to patient leaving prior to being seen by health care provider: Secondary | ICD-10-CM | POA: Insufficient documentation

## 2022-10-03 DIAGNOSIS — M791 Myalgia, unspecified site: Secondary | ICD-10-CM | POA: Insufficient documentation

## 2022-10-03 NOTE — ED Triage Notes (Signed)
Patient c/o generalized body aches, a non productive cough, and weakness x 3 days.

## 2023-04-03 LAB — AMB RESULTS CONSOLE CBG: Glucose: 93

## 2023-04-03 NOTE — Progress Notes (Signed)
Pt not fasting and does not have PCP. Education on BP given and info on clinics given

## 2023-04-21 ENCOUNTER — Encounter: Payer: Self-pay | Admitting: *Deleted

## 2023-04-21 NOTE — Progress Notes (Signed)
Pt attended 04/03/23 event where b/p was 145/94 and blood sugar was 93. Pt states on call that at the event, he confirmed he did not have a PCP and noted on call he had not established care with anyone because he could not afford the insurance at work. He also noted he had gotten some information "about hospitals" but not clinics or providers, so the [facility] brochure was mailed to him, noting some clinics are located in or beside some of the hospitals. This caller explained to pt about the Medicaid expansion opportunities, and the need to verify if he was eligible before [facility] or other financial assistance could be considered, so Medicaid enrollment flyer also sent to pt. Pt had indicated housing insecurities at the event, so housing resources also mailed to him w/ f/u contact info for this caller.

## 2023-06-11 ENCOUNTER — Encounter: Payer: Self-pay | Admitting: *Deleted

## 2023-06-11 NOTE — Progress Notes (Signed)
Pt attended 04/03/23 screening event where his blood sugar was 145/94 and 142/89 and his blood sugar was 93. At the time of event, the pt noted he did not have a PCP and did have a housing insecurity. The event nurse shared resources for both and some education r/t b/p control. During the first event follow up call, the pt noted he did not have insurance as the insurance at work was "too expensive" and asked for PCP info to be mailed as well as Medicaid eligibility info and housing resources. Health equity team member unable to contact pt by phone for 2nd event follow up. Chart review does not indicate that pt has established a PCP as no PCP encounter or insurance visible in [facility]. Therefore, 2nd letter sent with [employer] [facility] [employer] and [facility] Primary clinic flyers as well Medicaid eligibility and ACA info.

## 2023-10-20 ENCOUNTER — Encounter: Payer: Self-pay | Admitting: *Deleted

## 2023-10-20 NOTE — Progress Notes (Signed)
Pt attended 04/03/23 screening event where his b/p was 145/94 on retake but was originally 142/89 and his blood sugar was 93. At the event, the pt noted no PCP and identified food and housing insecurities. Event nurse noted she gave pt some [facility]N education information. During the initial event f/u call, pt shared he could not afford his insurance at work and therefore was not going to a PCP, so health equity team member sent pt info about housing , PCP, insurance eligibility. Health equity team member was unable to contact pt during 60 day and 6 month f/u attempts and chart review continues to reveal no new encounters with PCP or other [facility]-visible providers since the initial event. Final letter with [facility], [facility] flyers, Medicaid/ACA/ [facility] financial assistance info and [employer] contact info for f/u in case having ongoing food and housing needs, as well as food and housing [employer]. No additional health equity team support scheduled at this time

## 2024-02-06 ENCOUNTER — Emergency Department (HOSPITAL_COMMUNITY): Payer: Self-pay

## 2024-02-06 ENCOUNTER — Other Ambulatory Visit: Payer: Self-pay

## 2024-02-06 ENCOUNTER — Encounter (HOSPITAL_COMMUNITY): Payer: Self-pay

## 2024-02-06 ENCOUNTER — Emergency Department (HOSPITAL_COMMUNITY)
Admission: EM | Admit: 2024-02-06 | Discharge: 2024-02-06 | Disposition: A | Discharge status: Home / Self Care | Payer: Self-pay | Attending: Emergency Medicine | Admitting: Emergency Medicine

## 2024-02-06 DIAGNOSIS — R059 Cough, unspecified: Secondary | ICD-10-CM | POA: Insufficient documentation

## 2024-02-06 DIAGNOSIS — R109 Unspecified abdominal pain: Secondary | ICD-10-CM | POA: Insufficient documentation

## 2024-02-06 DIAGNOSIS — Y907 Blood alcohol level of 200-239 mg/100 ml: Secondary | ICD-10-CM | POA: Insufficient documentation

## 2024-02-06 DIAGNOSIS — R509 Fever, unspecified: Secondary | ICD-10-CM | POA: Insufficient documentation

## 2024-02-06 DIAGNOSIS — K625 Hemorrhage of anus and rectum: Secondary | ICD-10-CM | POA: Insufficient documentation

## 2024-02-06 DIAGNOSIS — F1090 Alcohol use, unspecified, uncomplicated: Secondary | ICD-10-CM | POA: Insufficient documentation

## 2024-02-06 LAB — CBC
HCT: 43.7 % (ref 39.0–52.0)
Hemoglobin: 14.2 g/dL (ref 13.0–17.0)
MCH: 33.3 pg (ref 26.0–34.0)
MCHC: 32.5 g/dL (ref 30.0–36.0)
MCV: 102.6 fL — ABNORMAL HIGH (ref 80.0–100.0)
Platelets: 219 10*3/uL (ref 150–400)
RBC: 4.26 MIL/uL (ref 4.22–5.81)
RDW: 13.8 % (ref 11.5–15.5)
WBC: 5.1 10*3/uL (ref 4.0–10.5)
nRBC: 0 % (ref 0.0–0.2)

## 2024-02-06 LAB — URINALYSIS, ROUTINE W REFLEX MICROSCOPIC
Bilirubin Urine: NEGATIVE
Glucose, UA: NEGATIVE mg/dL
Ketones, ur: 20 mg/dL — AB
Leukocytes,Ua: NEGATIVE
Nitrite: NEGATIVE
Protein, ur: NEGATIVE mg/dL
Specific Gravity, Urine: 1.045 — ABNORMAL HIGH (ref 1.005–1.030)
pH: 5 (ref 5.0–8.0)

## 2024-02-06 LAB — COMPREHENSIVE METABOLIC PANEL
ALT: 28 U/L (ref 0–44)
AST: 60 U/L — ABNORMAL HIGH (ref 15–41)
Albumin: 3.9 g/dL (ref 3.5–5.0)
Alkaline Phosphatase: 52 U/L (ref 38–126)
Anion gap: 20 — ABNORMAL HIGH (ref 5–15)
BUN: 12 mg/dL (ref 6–20)
CO2: 22 mmol/L (ref 22–32)
Calcium: 8.8 mg/dL — ABNORMAL LOW (ref 8.9–10.3)
Chloride: 97 mmol/L — ABNORMAL LOW (ref 98–111)
Creatinine, Ser: 0.85 mg/dL (ref 0.61–1.24)
GFR, Estimated: >60 mL/min (ref >60)
Glucose, Bld: 72 mg/dL (ref 70–99)
Potassium: 3.3 mmol/L — ABNORMAL LOW (ref 3.5–5.1)
Sodium: 139 mmol/L (ref 135–145)
Total Bilirubin: 0.8 mg/dL (ref 0.0–1.2)
Total Protein: 6.9 g/dL (ref 6.5–8.1)

## 2024-02-06 LAB — RESP PANEL BY RT-PCR (RSV, FLU A&B, COVID)  RVPGX2
Influenza A by PCR: NEGATIVE
Influenza B by PCR: NEGATIVE
Resp Syncytial Virus by PCR: NEGATIVE
SARS Coronavirus 2 by RT PCR: NEGATIVE

## 2024-02-06 LAB — POC OCCULT BLOOD, ED: Fecal Occult Bld: POSITIVE — AB

## 2024-02-06 LAB — LIPASE, BLOOD: Lipase: 20 U/L (ref 11–51)

## 2024-02-06 LAB — ETHANOL: Alcohol, Ethyl (B): 228 mg/dL — ABNORMAL HIGH (ref <10)

## 2024-02-06 MED ORDER — ONDANSETRON HCL 4 MG/2ML IJ SOLN
4.0000 mg | Freq: Once | INTRAMUSCULAR | Status: AC
  Administered 2024-02-06: 4 mg via INTRAVENOUS
  Filled 2024-02-06: qty 2

## 2024-02-06 MED ORDER — POTASSIUM CHLORIDE CRYS ER 20 MEQ PO TBCR
40.0000 meq | EXTENDED_RELEASE_TABLET | Freq: Once | ORAL | Status: AC
  Administered 2024-02-06: 40 meq via ORAL
  Filled 2024-02-06: qty 2

## 2024-02-06 MED ORDER — MORPHINE SULFATE (PF) 4 MG/ML IV SOLN
4.0000 mg | Freq: Once | INTRAVENOUS | Status: AC
  Administered 2024-02-06: 4 mg via INTRAVENOUS
  Filled 2024-02-06: qty 1

## 2024-02-06 MED ORDER — IOHEXOL 350 MG/ML SOLN
75.0000 mL | Freq: Once | INTRAVENOUS | Status: AC | PRN
  Administered 2024-02-06: 75 mL via INTRAVENOUS

## 2024-02-06 MED ORDER — SODIUM CHLORIDE 0.9 % IV BOLUS
1000.0000 mL | Freq: Once | INTRAVENOUS | Status: AC
  Administered 2024-02-06: 1000 mL via INTRAVENOUS

## 2024-02-06 NOTE — ED Provider Notes (Signed)
 [place] EMERGENCY DEPARTMENT AT [facility] Provider Note   CSN: [id number] Arrival date & time: 02/06/24  1116     History  Chief Complaint  Patient presents with   Rectal Bleeding   Abdominal Pain   Cough   HPI Stephen Nielsen is a 53 y.o. male presenting for abdominal pain and rectal bleeding and flulike symptoms. Abdominal pain and rectal bleeding started yesterday.  Pain is primarily on the right side and extends to the right flank.  Also endorses 1 episode of bright red blood in the stool yesterday. Patient states that he does drink a pint of liquor a day.  States his last drink was 2 days ago.  He does report he is trying to quit.  Also states he has vomited a couple times in the last week.  Denies any bowel changes.  Denies urinary symptoms.  Also states she has had 1 week of cough and chills with a subjective fever.   Rectal Bleeding Associated symptoms: abdominal pain   Abdominal Pain Associated symptoms: cough and hematochezia   Cough      Home Medications Prior to Admission medications   Medication Sig Start Date End Date Taking? Authorizing Provider  cyclobenzaprine (FLEXERIL) 10 MG tablet Take 1 tablet (10 mg total) by mouth 2 (two) times daily as needed for muscle spasms. Patient not taking: Reported on 05/27/2021 01/03/21   Fayrene Helper, PA-C  dicyclomine (BENTYL) 20 MG tablet Take 1 tablet (20 mg total) by mouth 3 (three) times daily as needed for spasms (abdominal cramping). 05/08/22   Long, Arlyss Repress, MD  HYDROcodone-acetaminophen (NORCO/VICODIN) 5-325 MG tablet Take 1 tablet by mouth every 4 (four) hours as needed. Patient not taking: Reported on 05/27/2021 02/01/21   Logan Bores, MD  ibuprofen (ADVIL) 600 MG tablet Take 1 tablet (600 mg total) by mouth every 6 (six) hours as needed. Patient not taking: Reported on 05/27/2021 01/03/21   Fayrene Helper, PA-C  lidocaine (LIDODERM) 5 % Place 1 patch onto the skin daily. Remove & Discard patch within 12 hours  or as directed by MD Patient not taking: Reported on 05/27/2021 11/04/20   Henderly, Britni A, PA-C  naproxen (NAPROSYN) 500 MG tablet Take 1 tablet (500 mg total) by mouth 2 (two) times daily. Patient not taking: Reported on 05/27/2021 11/04/20   Henderly, Britni A, PA-C  ondansetron (ZOFRAN-ODT) 4 MG disintegrating tablet Take 1 tablet (4 mg total) by mouth every 8 (eight) hours as needed for nausea or vomiting. 05/08/22   Long, Arlyss Repress, MD  sulfamethoxazole-trimethoprim (BACTRIM) 400-80 MG tablet Take 1 tablet by mouth 2 (two) times daily. 11/18/21   Andrey Campanile, MD      Allergies    Patient has no known allergies.    Review of Systems   Review of Systems  Respiratory:  Positive for cough.   Gastrointestinal:  Positive for abdominal pain and hematochezia.    Physical Exam Updated Vital Signs BP (!) 141/82 (BP Location: Right Arm)   Pulse 63   Temp 98.4 F (36.9 C) (Oral)   Resp 18   Ht 5\' 9"  (1.753 m)   Wt 65.8 kg   SpO2 100%   BMI 21.41 kg/m  Physical Exam Vitals and nursing note reviewed.  HENT:     Head: Normocephalic and atraumatic.     Mouth/Throat:     Mouth: Mucous membranes are moist.  Eyes:     General:        Right  eye: No discharge.        Left eye: No discharge.     Conjunctiva/sclera: Conjunctivae normal.  Cardiovascular:     Rate and Rhythm: Normal rate and regular rhythm.     Pulses: Normal pulses.     Heart sounds: Normal heart sounds.  Pulmonary:     Effort: Pulmonary effort is normal.     Breath sounds: Normal breath sounds.  Abdominal:     General: Abdomen is flat. There is no distension.     Palpations: Abdomen is soft.     Tenderness: There is abdominal tenderness in the right upper quadrant and right lower quadrant. There is right CVA tenderness.  Genitourinary:    Rectum: Guaiac result positive. No mass, tenderness, anal fissure or external hemorrhoid. Normal anal tone.  Skin:    General: Skin is warm and dry.  Neurological:      General: No focal deficit present.  Psychiatric:        Mood and Affect: Mood normal.     ED Results / Procedures / Treatments   Labs (all labs ordered are listed, but only abnormal results are displayed) Labs Reviewed  COMPREHENSIVE METABOLIC PANEL - Abnormal; Notable for the following components:      Result Value   Potassium 3.3 (*)    Chloride 97 (*)    Calcium 8.8 (*)    AST 60 (*)    Anion gap 20 (*)    All other components within normal limits  CBC - Abnormal; Notable for the following components:   MCV 102.6 (*)    All other components within normal limits  ETHANOL - Abnormal; Notable for the following components:   Alcohol, Ethyl (B) 228 (*)    All other components within normal limits  POC OCCULT BLOOD, ED - Abnormal; Notable for the following components:   Fecal Occult Bld POSITIVE (*)    All other components within normal limits  RESP PANEL BY RT-PCR (RSV, FLU A&B, COVID)  RVPGX2  LIPASE, BLOOD  URINALYSIS, ROUTINE W REFLEX MICROSCOPIC    EKG None  Radiology No results found.  Procedures Procedures    Medications Ordered in ED Medications  potassium chloride SA (KLOR-CON M) CR tablet 40 mEq (has no administration in time range)  morphine (PF) 4 MG/ML injection 4 mg (4 mg Intravenous Given 02/06/24 1359)  ondansetron (ZOFRAN) injection 4 mg (4 mg Intravenous Given 02/06/24 1358)  sodium chloride 0.9 % bolus 1,000 mL (1,000 mLs Intravenous New Bag/Given 02/06/24 1357)  iohexol (OMNIPAQUE) 350 MG/ML injection 75 mL (75 mLs Intravenous Contrast Given 02/06/24 1455)    ED Course/ Medical Decision Making/ A&P                                 Medical Decision Making Amount and/or Complexity of Data Reviewed Labs: ordered. Radiology: ordered.  Risk Prescription drug management.   Initial Impression and Ddx 53 year old well-appearing male presenting for abdominal pain and rectal bleeding.  Exam notable for right sided abdominal tenderness with  right-sided flank tenderness and he was guaiac positive.  DDx includes upper versus lower GI bleed, diverticulitis, acute hepatitis or cirrhosis, pyelonephritis, kidney stone, malignancy, other. Patient PMH that increases complexity of ED encounter: History of alcohol abuse  Interpretation of Diagnostics - I independent reviewed and interpreted the labs as followed: Hypokalemic, elevated blood alcohol, occult positive, anion gap  - CT ab/pelvis is pending  Patient Reassessment and  Ultimate Disposition/Management Signed out patient to PA Sabra Heck.  CT is still pending.  Disposition will be based on findings. If it is unremarkable, patient can follow-up with his PCP for his rectal bleeding.  Workup does not suggest symptomatic anemia.  On reassessment, he remains clinically well, abdominal pain has improved, and hemodynamically stable.  Patient management required discussion with the following services or consulting groups:  None  Complexity of Problems Addressed Acute complicated illness or Injury  Additional Data Reviewed and Analyzed Further history obtained from: Past medical history and medications listed in the EMR and Prior ED visit notes  Patient Encounter Risk Assessment Consideration of hospitalization        Final Clinical Impression(s) / ED Diagnoses Final diagnoses:  Abdominal pain, unspecified abdominal location  Rectal bleeding    Rx / DC Orders ED Discharge Orders     None         Gareth Eagle, PA-C 02/06/24 1534    Gwyneth Sprout, MD 02/07/24 [phone]

## 2024-02-06 NOTE — Discharge Instructions (Addendum)
 Thank you for letting us evaluate you today.  We have improved your abdominal pain with medicine here.  We have given you some potassium for mildly low potassium likely due to diarrhea.  Your CT was negative for acute abnormalities.  I provided you with a primary care follow-up for you to establish care and follow-up this week.  This is extremely important as we need to likely set you up for a colonoscopy and monitor how you are feeling this week to ensure you do not become anemic/have low blood counts.  Also, please try to slowly decrease amount of alcohol you consume each day as this may be contributing.  Return to emergency department if you experience significant worsening of bleeding, dizziness, appear pale

## 2024-02-06 NOTE — ED Triage Notes (Addendum)
 Pt c.o flu like s.s x 1 week- cough, chills, emesis. Pt also c.o bright red blood in his stool x1 episode yesterday. Pt also c.o RUQ pain. Pt states he drinks a pint of liquor a day. Pt last drink was 2 days ago. Pt endorsing tremors in triage

## 2024-02-06 NOTE — ED Provider Notes (Signed)
 Received patient at signout from previous provider.  See his note.  In short, patient presents to emergency department for evaluation of right upper and lower quadrant abdominal tenderness with associated right flank tenderness and 1 episode of rectal bleeding that started yesterday.  Past medical history significant for significant alcohol use daily.  He has never had a GI bleed in the past.  Upon reassessment, patient has significantly improved.  His abdominal pain has decreased and he has not had any episodes of rectal bleeding here in the emergency department.  His workup is significant for hemoglobin of 14.2 and fecal occult positive.  AST 68 likely due to daily alcohol use.  He does have a elevated anion gap of 20 which is also likely due to chronic alcohol use.  1 year ago, anion gap was 16.  He was provided morphine, Zofran, IVF for pain, nausea, likely dehydration and alcohol use significantly improving symptoms.  As patient has remained hemodynamically stable here in the emergency department with no hypotension or tachycardia, I feel that it is safe to discharge patient with follow-up to PCP regarding rectal bleeding.  Patient does not have a PCP and has not establish care with 1.  I provided him with [facility] [place] [facility] as recommendation.  I discussed importance of following up with primary care provider as he likely needs a colonoscopy as he is never had 1 especially in the setting of rectal bleeding as well as follow-up to ensure he does not become anemic or symptomatic.  Discussed ED workup, disposition, strict return to emergency department precautions with patient expresses understand agrees with plan.  All questions answered to her satisfaction.  He is agreeable discharge.  Discharge instructions provided on paperwork     Judithann Sheen, PA 02/06/24 Elisabeth Cara, [place] K, DO 02/06/24 2034

## 2024-02-15 ENCOUNTER — Ambulatory Visit (INDEPENDENT_AMBULATORY_CARE_PROVIDER_SITE_OTHER): Payer: Self-pay | Admitting: Primary Care

## 2024-02-15 ENCOUNTER — Encounter (INDEPENDENT_AMBULATORY_CARE_PROVIDER_SITE_OTHER): Payer: Self-pay | Admitting: Primary Care

## 2024-02-15 VITALS — BP 146/88 | HR 74 | Temp 98.0°F | Resp 14 | Ht 69.0 in | Wt 147.0 lb

## 2024-02-15 DIAGNOSIS — I10 Essential (primary) hypertension: Secondary | ICD-10-CM

## 2024-02-15 DIAGNOSIS — Z720 Tobacco use: Secondary | ICD-10-CM

## 2024-02-15 DIAGNOSIS — Z09 Encounter for follow-up examination after completed treatment for conditions other than malignant neoplasm: Secondary | ICD-10-CM

## 2024-02-15 DIAGNOSIS — F17210 Nicotine dependence, cigarettes, uncomplicated: Secondary | ICD-10-CM

## 2024-02-15 DIAGNOSIS — Z23 Encounter for immunization: Secondary | ICD-10-CM

## 2024-02-15 DIAGNOSIS — Z1211 Encounter for screening for malignant neoplasm of colon: Secondary | ICD-10-CM

## 2024-02-15 DIAGNOSIS — Z7689 Persons encountering health services in other specified circumstances: Secondary | ICD-10-CM

## 2024-02-15 MED ORDER — AMLODIPINE BESYLATE 10 MG PO TABS: 10.0000 mg | ORAL_TABLET | Freq: Every day | ORAL | 1 refills | Status: AC

## 2024-02-15 NOTE — Progress Notes (Unsigned)
 New Patient Office Visit  Subjective    Patient ID: Stephen Nielsen male  DOB: January 28, 1971  Age: 53 y.o. MRN: [id number]   CC:  none   HPI  Stephen Nielsen is a 53 year old male is a who presents today to establish care and was seen and the emergency room on February 06, 2024 for rectal bleeding, abdominal pain and cough.  Patient's FOBT was positive and elevated AST likely due to chronic alcohol use.  He has not had any rectal bleeding.  He is due for colonoscopy and refer him to GI. Current Outpatient Medications on File Prior to Visit  Medication Sig Dispense Refill   cyclobenzaprine (FLEXERIL) 10 MG tablet Take 1 tablet (10 mg total) by mouth 2 (two) times daily as needed for muscle spasms. (Patient not taking: Reported on 02/15/2024) 20 tablet 0   dicyclomine (BENTYL) 20 MG tablet Take 1 tablet (20 mg total) by mouth 3 (three) times daily as needed for spasms (abdominal cramping). (Patient not taking: Reported on 02/15/2024) 20 tablet 0   HYDROcodone-acetaminophen (NORCO/VICODIN) 5-325 MG tablet Take 1 tablet by mouth every 4 (four) hours as needed. (Patient not taking: Reported on 02/15/2024) 10 tablet 0   ibuprofen (ADVIL) 600 MG tablet Take 1 tablet (600 mg total) by mouth every 6 (six) hours as needed. (Patient not taking: Reported on 02/15/2024) 30 tablet 0   lidocaine (LIDODERM) 5 % Place 1 patch onto the skin daily. Remove & Discard patch within 12 hours or as directed by MD (Patient not taking: Reported on 02/15/2024) 30 patch 0   naproxen (NAPROSYN) 500 MG tablet Take 1 tablet (500 mg total) by mouth 2 (two) times daily. (Patient not taking: Reported on 02/15/2024) 30 tablet 0   ondansetron (ZOFRAN-ODT) 4 MG disintegrating tablet Take 1 tablet (4 mg total) by mouth every 8 (eight) hours as needed for nausea or vomiting. (Patient not taking: Reported on 02/15/2024) 20 tablet 0   sulfamethoxazole-trimethoprim (BACTRIM) 400-80 MG tablet Take 1 tablet by mouth 2 (two) times daily. (Patient  not taking: Reported on 02/15/2024) 20 tablet 0   No current facility-administered medications on file prior to visit.     No Known Allergies  History reviewed. No pertinent past medical history.   History reviewed. No pertinent surgical history.   Family History  Family history unknown: Yes    Social History   Socioeconomic History   Marital status: Single    Spouse name: Not on file   Number of children: Not on file   Years of education: Not on file   Highest education level: Not on file  Occupational History   Not on file  Tobacco Use   Smoking status: Former    Current packs/day: 0.50    Average packs/day: 0.5 packs/day for 35.2 years (17.6 ttl pk-yrs)    Types: Cigarettes    Start date: 77   Smokeless tobacco: Never  Vaping Use   Vaping status: Never Used  Substance and Sexual Activity   Alcohol use: Yes    Comment: 1 pint of liquor per day   Drug use: No   Sexual activity: Not Currently  Other Topics Concern   Not on file  Social History Narrative   Not on file   Social Drivers of Health   Financial Resource Strain: Not on file  Food Insecurity: No Food Insecurity (04/03/2023)   Hunger Vital Sign    Worried About Running Out of Food in the Last Year: Never true  Ran Out of Food in the Last Year: Never true  Transportation Needs: No Transportation Needs (04/03/2023)   PRAPARE - Administrator, Civil Service (Medical): No    Lack of Transportation (Non-Medical): No  Physical Activity: Not on file  Stress: Not on file  Social Connections: Not on file  Intimate Partner Violence: Not At Risk (04/03/2023)   Humiliation, Afraid, Rape, and Kick questionnaire    Fear of Current or Ex-Partner: No    Emotionally Abused: No    Physically Abused: No    Sexually Abused: No   Health Maintenance  Topic Date Due   Colon Cancer Screening  Never done   Zoster (Shingles) Vaccine (1 of 2) Never done   COVID-19 Vaccine (1 - 2024-25 season) Never done    DTaP/Tdap/Td vaccine (2 - Td or Tdap) 08/03/2027   Flu Shot  Completed   Hepatitis C Screening  Completed   HIV Screening  Completed   Pneumococcal Vaccination  Aged Out   HPV Vaccine  Aged Out    Objective    BP (!) 146/88 (Cuff Size: Normal)   Pulse 74   Temp 98 F (36.7 C) (Oral)   Resp 14   Ht 5\' 9"  (1.753 m)   Wt 147 lb (66.7 kg)   SpO2 99%   BMI 21.71 kg/m   Physical Exam General: No apparent distress.Thin frame  Eyes: Extraocular eye movements intact, pupils equal and round. Neck: Supple, trachea midline. Thyroid: No enlargement, mobile without fixation, no tenderness. Cardiovascular: Regular rhythm and rate, no murmur, normal radial pulses. Respiratory: Normal respiratory effort, clear to auscultation. Gastrointestinal: Normal pitch active bowel sounds, nontender abdomen without distention or appreciable hepatomegaly. Musculoskeletal: Normal muscle tone, no tenderness on palpation of tibia, no excessive thoracic kyphosis. Skin: Appropriate warmth, no visible rash. Mental status: Alert, conversant, speech clear, thought logical, appropriate mood and affect, no hallucinations or delusions evident. Hematologic/lymphatic: No cervical adenopathy, no visible ecchymoses.    Assessment & Plan:  Stephen Nielsen was seen today for hospitalization follow-up.  Diagnoses and all orders for this visit:  Colon cancer screening -     Ambulatory referral to Gastroenterology  Encounter to establish care  Hospital discharge follow-up See HPI  Essential hypertension BP goal - < 130/80 Explained that having normal blood pressure is the goal and medications are helping to get to goal and maintain normal blood pressure. DIET: Limit salt intake, read nutrition labels to check salt content, limit fried and high fatty foods  Avoid using multisymptom OTC cold preparations that generally contain sudafed which can rise BP. Consult with pharmacist on best cold relief products to use for persons  with HTN EXERCISE Discussed incorporating exercise such as walking - 30 minutes most days of the week and can do in 10 minute intervals    -     amLODipine (NORVASC) 10 MG tablet; Take 1 tablet (10 mg total) by mouth daily.  Immunization due -     Pneumococcal conjugate vaccine 13-valent IM -     Flu Vaccine QUAD 34mo+IM (Fluarix, Fluzone & Alfiuria Quad PF)  Tobacco abuse - I have recommended complete cessation of tobacco use. I have discussed various options available for assistance with tobacco cessation including over the counter methods (Nicotine gum, patch and lozenges). We also discussed prescription options (Chantix, Nicotine Inhaler / Nasal Spray). The patient is not interested in pursuing any prescription tobacco cessation options at this time. - Patient declines at this time.  - Less than 5  minutes spent on counseling.   Follow-up:    The above assessment and management plan was discussed with the patient. The patient verbalized understanding of and has agreed to the management plan. Patient is aware to call the clinic if symptoms fail to improve or worsen. Patient is aware when to return to the clinic for a follow-up visit. Patient educated on when it is appropriate to go to the emergency department.   Gwinda Passe, NP-C

## 2024-02-29 ENCOUNTER — Telehealth (INDEPENDENT_AMBULATORY_CARE_PROVIDER_SITE_OTHER): Payer: Self-pay | Admitting: Primary Care

## 2024-02-29 NOTE — Telephone Encounter (Signed)
 Left VM with pt about their upcoming appt.

## 2024-03-07 ENCOUNTER — Ambulatory Visit (INDEPENDENT_AMBULATORY_CARE_PROVIDER_SITE_OTHER): Payer: Self-pay | Admitting: Primary Care

## 2024-03-17 ENCOUNTER — Ambulatory Visit (INDEPENDENT_AMBULATORY_CARE_PROVIDER_SITE_OTHER): Payer: Self-pay | Admitting: Primary Care

## 2024-03-17 ENCOUNTER — Encounter (INDEPENDENT_AMBULATORY_CARE_PROVIDER_SITE_OTHER): Payer: Self-pay | Admitting: Primary Care

## 2024-03-17 VITALS — BP 145/78 | HR 70 | Resp 16 | Ht 69.0 in | Wt 150.0 lb

## 2024-03-17 DIAGNOSIS — R200 Anesthesia of skin: Secondary | ICD-10-CM

## 2024-03-17 DIAGNOSIS — E876 Hypokalemia: Secondary | ICD-10-CM

## 2024-03-17 DIAGNOSIS — I10 Essential (primary) hypertension: Secondary | ICD-10-CM

## 2024-03-17 NOTE — Progress Notes (Signed)
 Intermittant hand cramps

## 2024-03-17 NOTE — Patient Instructions (Signed)
 Potassium Content of Foods  The body needs potassium to control blood pressure and to keep the muscles and nervous system healthy. Here are some healthy foods below that are high in potassium. Also you can get the white label salt of "NO SALT" salt substitute, 1/4 teaspoon of this is equivalent to potassium.   FOODS AND DRINKS HIGH IN POTASSIUM FOODS MODERATE IN POTASSIUM   Fruits Avocado (cubed),  c / 50 g. Banana (sliced), 75 g. Cantaloupe (cubed), 80 g. Honeydew, 1 wedge / 85 g. Kiwi (sliced), 90 g. Nectarine, 1 small / 129 g. Orange, 1 medium / 131 g. Vegetables Artichoke,  of a medium / 64 g. Asparagus (boiled), 90 g.. Broccoli (boiled), 78 g. Brussels sprout (boiled), 78 g. Butternut squash (baked), 103 g. Chickpea (cooked), 82 g. Green peas (cooked), 80 g. Kidney beans (cooked), 5 tbsp / 55 g. Lima beans (cooked),  c / 43 g. Navy beans (cooked),  c / 61 g. Spinach (cooked),  c / 45 g. Sweet potato (baked),  c / 50 g. Tomato (chopped or sliced), 90 g. Vegetable juice. White mushrooms (cooked), 78 g. Yam (cooked or baked),  c / 34 g. Zucchini squash (boiled), 90 g. Other Foods and Drinks Almonds (whole),  c / 36 g. Fish, 3 oz / 85 g. Nonfat fruit variety yogurt, 123 g. Pistachio nuts, 1 oz / 28 g. Pumpkin seeds, 1 oz / 28 g. Red meat (broiled, cooked, grilled), 3 oz / 85 g. Scallops (steamed), 3 oz / 85 g. Spaghetti sauce,  c / 66 g. Sunflower seeds (dry roasted), 1 oz / 28 g. Veggie burger, 1 patty / 70 g. Fruits Grapefruit,  of the fruit / 123 g Plums (sliced), 83 g. Tangerine, 1 large / 120 g. Vegetables Carrots (boiled), 78 g. Carrots (sliced), 61 g. Rhubarb (cooked with sugar), 120 g. Rutabaga (cooked), 120 g. Yellow snap beans (cooked), 63 g. Other Foods and Drinks  Chicken breast (roasted and chopped),  c / 70 g. Pita bread, 1 large / 64 g. Shrimp (steamed), 4 oz / 113 g. Swiss cheese (diced), 70 g.    Hypertension,  Adult Hypertension is another name for high blood pressure. High blood pressure forces your heart to work harder to pump blood. This can cause problems over time. There are two numbers in a blood pressure reading. There is a top number (systolic) over a bottom number (diastolic). It is best to have a blood pressure that is below 120/80. What are the causes? The cause of this condition is not known. Some other conditions can lead to high blood pressure. What increases the risk? Some lifestyle factors can make you more likely to develop high blood pressure: Smoking. Not getting enough exercise or physical activity. Being overweight. Having too much fat, sugar, calories, or salt (sodium) in your diet. Drinking too much alcohol. Other risk factors include: Having any of these conditions: Heart disease. Diabetes. High cholesterol. Kidney disease. Obstructive sleep apnea. Having a family history of high blood pressure and high cholesterol. Age. The risk increases with age. Stress. What are the signs or symptoms? High blood pressure may not cause symptoms. Very high blood pressure (hypertensive crisis) may cause: Headache. Fast or uneven heartbeats (palpitations). Shortness of breath. Nosebleed. Vomiting or feeling like you may vomit (nauseous). Changes in how you see. Very bad chest pain. Feeling dizzy. Seizures. How is this treated? This condition is treated by making healthy lifestyle changes, such as: Eating healthy foods. Exercising more.  Drinking less alcohol. Your doctor may prescribe medicine if lifestyle changes do not help enough and if: Your top number is above 130. Your bottom number is above 80. Your personal target blood pressure may vary. Follow these instructions at home: Eating and drinking  If told, follow the DASH eating plan. To follow this plan: Fill one half of your plate at each meal with fruits and vegetables. Fill one fourth of your plate at each meal  with whole grains. Whole grains include whole-wheat pasta, brown rice, and whole-grain bread. Eat or drink low-fat dairy products, such as skim milk or low-fat yogurt. Fill one fourth of your plate at each meal with low-fat (lean) proteins. Low-fat proteins include fish, chicken without skin, eggs, beans, and tofu. Avoid fatty meat, cured and processed meat, or chicken with skin. Avoid pre-made or processed food. Limit the amount of salt in your diet to less than 1,500 mg each day. Do not drink alcohol if: Your doctor tells you not to drink. You are pregnant, may be pregnant, or are planning to become pregnant. If you drink alcohol: Limit how much you have to: 0-1 drink a day for women. 0-2 drinks a day for men. Know how much alcohol is in your drink. In the [place] one drink equals one 12 oz bottle of beer (355 mL), one 5 oz glass of wine (148 mL), or one 1 oz glass of hard liquor (44 mL). Lifestyle  Work with your doctor to stay at a healthy weight or to lose weight. Ask your doctor what the best weight is for you. Get at least 30 minutes of exercise that causes your heart to beat faster (aerobic exercise) most days of the week. This may include walking, swimming, or biking. Get at least 30 minutes of exercise that strengthens your muscles (resistance exercise) at least 3 days a week. This may include lifting weights or doing Pilates. Do not smoke or use any products that contain nicotine or tobacco. If you need help quitting, ask your doctor. Check your blood pressure at home as told by your doctor. Keep all follow-up visits. Medicines Take over-the-counter and prescription medicines only as told by your doctor. Follow directions carefully. Do not skip doses of blood pressure medicine. The medicine does not work as well if you skip doses. Skipping doses also puts you at risk for problems. Ask your doctor about side effects or reactions to medicines that you should watch for. Contact a  doctor if: You think you are having a reaction to the medicine you are taking. You have headaches that keep coming back. You feel dizzy. You have swelling in your ankles. You have trouble with your vision. Get help right away if: You get a very bad headache. You start to feel mixed up (confused). You feel weak or numb. You feel faint. You have very bad pain in your: Chest. Belly (abdomen). You vomit more than once. You have trouble breathing. These symptoms may be an emergency. Get help right away. Call 911. Do not wait to see if the symptoms will go away. Do not drive yourself to the hospital. Summary Hypertension is another name for high blood pressure. High blood pressure forces your heart to work harder to pump blood. For most people, a normal blood pressure is less than 120/80. Making healthy choices can help lower blood pressure. If your blood pressure does not get lower with healthy choices, you may need to take medicine. This information is not intended to replace advice  given to you by your health care provider. Make sure you discuss any questions you have with your health care provider. Document Revised: 09/19/2021 Document Reviewed: 09/19/2021 Elsevier Patient Education  2024 [employer].

## 2024-03-17 NOTE — Progress Notes (Signed)
 [facility]  Theo Krumholz, is a 53 y.o. male  [id number]  [id number]  DOB - 01-11-1971  Chief Complaint  Patient presents with   hand cramping   Hypertension       Subjective:   Stacey Maura is a 53 y.o. male here today for an acute visit.  Patient has a primary care that he is followed by Lavinia Sharps.  He is having this visit for hand cramping-bilateral for the last 4 month problems grabbing objects and writing- fingers lock up. Negative for Tinel's sign Tinel and Phalen's test.  Bp is elevated compliant with amlodipine 10mg   took today.    No problems updated.  Comprehensive ROS Pertinent positive and negative noted in HPI   No Known Allergies  History reviewed. No pertinent past medical history.  Current Outpatient Medications on File Prior to Visit  Medication Sig Dispense Refill   amLODipine (NORVASC) 10 MG tablet Take 1 tablet (10 mg total) by mouth daily. 90 tablet 1   No current facility-administered medications on file prior to visit.   Health Maintenance  Topic Date Due   Colon Cancer Screening  Never done   Zoster (Shingles) Vaccine (1 of 2) Never done   COVID-19 Vaccine (1 - 2024-25 season) Never done   Flu Shot  07/15/2024   DTaP/Tdap/Td vaccine (2 - Td or Tdap) 08/03/2027   Hepatitis C Screening  Completed   HIV Screening  Completed   Pneumococcal Vaccination  Aged Out   HPV Vaccine  Aged Out    Objective:   Vitals:   03/17/24 1412  BP: (!) 149/84  Pulse: 70  Resp: 16  SpO2: 99%  Weight: 150 lb (68 kg)  Height: 5\' 9"  (1.753 m)   BP Readings from Last 3 Encounters:  03/17/24 (!) 149/84  02/15/24 (!) 146/88  02/06/24 (!) 142/70   Physical Exam Vitals reviewed.  Constitutional:      Appearance: He is normal weight.  HENT:     Head: Normocephalic.     Right Ear: External ear normal.     Left Ear: External ear normal.     Nose: Nose normal.  Eyes:     Extraocular Movements: Extraocular movements intact.   Cardiovascular:     Rate and Rhythm: Normal rate and regular rhythm.  Pulmonary:     Effort: Pulmonary effort is normal.     Breath sounds: Normal breath sounds.  Abdominal:     General: Bowel sounds are normal.     Palpations: Abdomen is soft.  Musculoskeletal:        General: Normal range of motion.     Cervical back: Normal range of motion.  Skin:    General: Skin is warm and dry.  Neurological:     Mental Status: He is alert and oriented to person, place, and time.  Psychiatric:        Mood and Affect: Mood normal.        Behavior: Behavior normal.        Thought Content: Thought content normal.        Judgment: Judgment normal.    Assessment & Plan  Humzah was seen today for hand cramping and hypertension.  Diagnoses and all orders for this visit:  Essential hypertension Not monitoring sodium intake BP goal - < 130/80 Explained that having normal blood pressure is the goal and medications are helping to get to goal and maintain normal blood pressure. DIET: Limit salt intake, read nutrition labels to check  salt content, limit fried and high fatty foods  Avoid using multisymptom OTC cold preparations that generally contain sudafed which can rise BP. Consult with pharmacist on best cold relief products to use for persons with HTN EXERCISE Discussed incorporating exercise such as walking - 30 minutes most days of the week and can do in 10 minute intervals     Bilateral hand numbness Sharp pain recc. F/u with orth 10/10 takes nothing    Hypokalemia  K+ 3.3 list of foods provided to increase potassium on avs  Patient have been counseled extensively about nutrition and exercise. Other issues discussed during this visit include: low cholesterol diet, weight control and daily exercise, foot care, annual eye examinations at Ophthalmology, importance of adherence with medications and regular follow-up. We also discussed long term complications of uncontrolled diabetes and  hypertension.   Return for PCP .  The patient was given clear instructions to go to ER or return to medical center if symptoms don't improve, worsen or new problems develop. The patient verbalized understanding. The patient was told to call to get lab results if they haven't heard anything in the next week.   This note has been created with Education officer, environmental. Any transcriptional errors are unintentional.   Grayce Sessions, NP 03/17/2024, 2:40 PM
